# Patient Record
Sex: Female | Born: 1975
Health system: Southern US, Community
[De-identification: ages and names within clinical notes are randomized; demographics above are authoritative.]

## PROBLEM LIST (undated history)

## (undated) DIAGNOSIS — F419 Anxiety disorder, unspecified: Secondary | ICD-10-CM

## (undated) DIAGNOSIS — I10 Essential (primary) hypertension: Secondary | ICD-10-CM

## (undated) DIAGNOSIS — T4145XA Adverse effect of unspecified anesthetic, initial encounter: Secondary | ICD-10-CM

## (undated) DIAGNOSIS — O139 Gestational [pregnancy-induced] hypertension without significant proteinuria, unspecified trimester: Secondary | ICD-10-CM

## (undated) DIAGNOSIS — F329 Major depressive disorder, single episode, unspecified: Secondary | ICD-10-CM

## (undated) DIAGNOSIS — M47816 Spondylosis without myelopathy or radiculopathy, lumbar region: Secondary | ICD-10-CM

## (undated) DIAGNOSIS — F32A Depression, unspecified: Secondary | ICD-10-CM

## (undated) DIAGNOSIS — T8859XA Other complications of anesthesia, initial encounter: Secondary | ICD-10-CM

## (undated) DIAGNOSIS — M722 Plantar fascial fibromatosis: Secondary | ICD-10-CM

## (undated) HISTORY — DX: Depression, unspecified: F32.A

## (undated) HISTORY — DX: Other complications of anesthesia, initial encounter: T88.59XA

## (undated) HISTORY — DX: Gestational (pregnancy-induced) hypertension without significant proteinuria, unspecified trimester: O13.9

## (undated) HISTORY — DX: Major depressive disorder, single episode, unspecified: F32.9

## (undated) HISTORY — DX: Plantar fascial fibromatosis: M72.2

## (undated) HISTORY — DX: Spondylosis without myelopathy or radiculopathy, lumbar region: M47.816

## (undated) HISTORY — PX: BARTHOLIN GLAND CYST EXCISION: SHX565

## (undated) HISTORY — DX: Essential (primary) hypertension: I10

## (undated) HISTORY — DX: Anxiety disorder, unspecified: F41.9

## (undated) HISTORY — DX: Adverse effect of unspecified anesthetic, initial encounter: T41.45XA

## (undated) HISTORY — PX: EYE SURGERY: SHX253

---

## 1997-10-19 ENCOUNTER — Other Ambulatory Visit: Admission: RE | Admit: 1997-10-19 | Discharge: 1997-10-19 | Payer: Self-pay | Admitting: Obstetrics and Gynecology

## 1998-10-13 ENCOUNTER — Other Ambulatory Visit: Admission: RE | Admit: 1998-10-13 | Discharge: 1998-10-13 | Payer: Self-pay | Admitting: Obstetrics and Gynecology

## 1999-04-14 ENCOUNTER — Other Ambulatory Visit: Admission: RE | Admit: 1999-04-14 | Discharge: 1999-04-14 | Payer: Self-pay | Admitting: Obstetrics & Gynecology

## 1999-11-17 ENCOUNTER — Inpatient Hospital Stay (HOSPITAL_COMMUNITY): Admission: AD | Admit: 1999-11-17 | Discharge: 1999-11-20 | Payer: Self-pay | Admitting: Obstetrics and Gynecology

## 2000-10-11 ENCOUNTER — Other Ambulatory Visit: Admission: RE | Admit: 2000-10-11 | Discharge: 2000-10-11 | Payer: Self-pay | Admitting: Obstetrics and Gynecology

## 2001-11-13 ENCOUNTER — Other Ambulatory Visit: Admission: RE | Admit: 2001-11-13 | Discharge: 2001-11-13 | Payer: Self-pay | Admitting: Obstetrics and Gynecology

## 2003-02-27 ENCOUNTER — Other Ambulatory Visit: Admission: RE | Admit: 2003-02-27 | Discharge: 2003-02-27 | Payer: Self-pay | Admitting: Obstetrics and Gynecology

## 2004-04-20 ENCOUNTER — Other Ambulatory Visit: Admission: RE | Admit: 2004-04-20 | Discharge: 2004-04-20 | Payer: Self-pay | Admitting: Obstetrics and Gynecology

## 2005-03-27 HISTORY — PX: CYSTECTOMY: SUR359

## 2005-08-04 ENCOUNTER — Other Ambulatory Visit: Admission: RE | Admit: 2005-08-04 | Discharge: 2005-08-04 | Payer: Self-pay | Admitting: Obstetrics and Gynecology

## 2005-09-07 ENCOUNTER — Ambulatory Visit (HOSPITAL_COMMUNITY): Admission: RE | Admit: 2005-09-07 | Discharge: 2005-09-07 | Payer: Self-pay | Admitting: Obstetrics and Gynecology

## 2005-09-07 ENCOUNTER — Encounter (INDEPENDENT_AMBULATORY_CARE_PROVIDER_SITE_OTHER): Payer: Self-pay | Admitting: *Deleted

## 2007-05-25 ENCOUNTER — Inpatient Hospital Stay (HOSPITAL_COMMUNITY): Admission: AD | Admit: 2007-05-25 | Discharge: 2007-05-27 | Payer: Self-pay | Admitting: Obstetrics and Gynecology

## 2010-08-09 NOTE — H&P (Signed)
NAME:  Mary Gross, Mary Gross                ACCOUNT NO.:  [id number]   MEDICAL RECORD NO.:  [id number]          PATIENT TYPE:  MAT   LOCATION:  [facility]                          FACILITY:  [facility]   PHYSICIAN:  Osborn Coho, M.D.   DATE OF BIRTH:  11-May-1975   DATE OF ADMISSION:  05/25/2007  DATE OF DISCHARGE:                              HISTORY & PHYSICAL   HISTORY OF PRESENT ILLNESS:  The patient is a 35 year old, gravida 2,  para 1-0-0-1, who was admitted at 40-5/7 weeks with complaints of  regular uterine contractions since 4 p.m. on May 25, 2007, with  increased intensity.  The patient denies rupture of membranes or  bleeding.  The patient reports that her fetus has been moving normally.  The patient denies headache, vision changes, right upper quadrant pain  or increased edema.  The patient's pregnancy has been followed by the  CNM service at [facility]; however, no prenatal record is  available throughout the hospital-wide system at the present time.  The  patient's pregnancy remarkable for  1. History of pregnancy-induced hypertension with first pregnancy.  2. PENICILLIN allergy.  3. GBS negative.   PAST MEDICAL HISTORY:  Negative.   PAST SURGICAL HISTORY:  I&D of Bartholin's cyst in 2008.   OB HISTORY:  Pregnancy #1:  Vacuum delivery at term, viable female infant  in 2001 with pregnancy-induced hypertension but no other complications.   GYN HISTORY:  The patient denies any history of STDs or abnormal Pap  smears.   FAMILY HISTORY:  Significant for hypertension, CVA, and cancer.   CURRENT MEDICATIONS:  Prenatal vitamins.   ALLERGIES:  PENICILLIN, SULFA,  CODEINE, and IRON.   SOCIAL HISTORY:  The patient denies use of tobacco, alcohol, or street  drugs.   GENETIC HISTORY:  Negative.   PRENATAL LABORATORY DATA:  Blood type is B positive.  Antibody screen  negative.  Initial hemoglobin 12.1.  RPR negative, hepatitis B surface  antigen negative, HIV negative,  rubella immune.  A 28-week Glucola 122.  RPR negative, group B strep negative, gonorrhea and chlamydia cultures  negative.   HISTORY OF PRESENT PREGNANCY:  Due to prenatal record not being  available, pregnancy course is relatively unavailable.  However, with  the data of prenatal labs that were available, the patient entered care  at approximately [redacted] weeks gestation.  The patient did undergo an  ultrasound at 18 weeks that she reports was normal.  The patient did  have labs as scheduled throughout the pregnancy.   OBJECTIVE:  VITAL SIGNS:  The patient is afebrile.  Temperature 98.5.  Initial blood pressure 138/92 with repeat at 142/104.  Heart rate 84.  Fetal heart rate 140-150 baseline with accelerations present.  No  decelerations are noted.  HEENT:  Within normal limits.  NECK:  Thyroid normal, not enlarged.  HEART:  Regular rate and rhythm.  LUNGS:  Clear to percussion and auscultation.  ABDOMEN:  Soft, gravid and nontender.  Fundus is soft and nontender.  Fundal height 40 cm.  Fetus is vertex to [employer].  Uterine  contractions noted approximately every  2-3 minutes and mild to  palpation.  PELVIC:  Cervix 5 cm, 80% effaced, -2 station, with a bulging bag of  water on admission.  EXTREMITIES:  Trace edema, 1+ deep tendon reflexes, and no clonus.   ASSESSMENT:  1. Intrauterine pregnancy at 40-5/7 weeks.  2. Pregnancy-induced hypertension, rule out preeclampsia.   PLAN:  1. Patient admitted to birthing suite for consult with Dr. Su Hilt.  2. Will check PIH labs on admission.  3. The patient's labor is to be augmented with Pitocin at the present      time.  4. The patient desire epidural anesthesia, and epidural will be placed      at her request.      Rhona Leavens, CNM      Osborn Coho, M.D.  Electronically Signed    NOS/MEDQ  D:  05/25/2007  T:  05/26/2007  Job:  [id number]

## 2010-08-09 NOTE — H&P (Signed)
NAME:  Mary Gross, Mary Gross                ACCOUNT NO.:  [id number]   MEDICAL RECORD NO.:  [id number]          PATIENT TYPE:  MAT   LOCATION:  [facility]                          [facility]   PHYSICIAN:  Osborn Coho, M.D.   DATE OF BIRTH:  1975/08/15   DATE OF ADMISSION:  05/25/2007  DATE OF DISCHARGE:                              HISTORY & PHYSICAL   inaudible      Rhona Leavens, CNM      Osborn Coho, M.D.  Electronically Signed    NOS/MEDQ  D:  05/25/2007  T:  05/26/2007  Job:  [id number]

## 2010-08-12 NOTE — H&P (Signed)
[name], Gross           ACCOUNT NO.:  [id number]   MEDICAL RECORD NO.:  [id number]          PATIENT TYPE:  AMB   LOCATION:  [facility]                           [facility]  [facility]   PHYSICIAN:  Hal Morales, M.D.DATE OF BIRTH:  05-Sep-1975   DATE OF ADMISSION:  09/07/2005  DATE OF DISCHARGE:                                HISTORY & PHYSICAL   HISTORY OF PRESENT ILLNESS:  [name] is a 35 year old white female  with a history of recurrent left Bartholin's abscesses, who presents for  surgical management of a left vulvar cyst.  For the past four years, the  patient has experienced on a yearly basis a left Bartholin's abscess which  was managed with incision and drainage.  Between episodes of inflammation  and infection, the patient had a persistent cyst; however, it was  asymptomatic.  The patient admits that incision and drainage procedure  helped to relieve the pain associated with these episodes; however, she  desires to pursue a more definitive therapy due to the disruptive nature of  these infections.   PAST MEDICAL HISTORY:   OB HISTORY:  Gravida 1, para 1-0-0-1.   GYN HISTORY:  Menarche 35 years old.  The patient's last menstrual period  was Aug 20, 2005.  She uses vasectomy as mode of contraception. Denies any  history of abnormal Pap smears or sexually transmitted diseases (negative  gonorrhea and chlamydia cultures Aug 04, 2005).  Last normal Pap smear was  May 2007.   MEDICAL HISTORY:  Positive for elevated blood pressure.   SURGICAL HISTORY:  Negative.   FAMILY HISTORY:  Hypertension, migraines.   HABITS:  The patient does not use alcohol or tobacco.   SOCIAL HISTORY:  The patient is currently separated from her husband, and  she is a homemaker.   CURRENT MEDICATIONS:  None.   ALLERGIES:  No known drug allergies.   REVIEW OF SYSTEMS:  The patient does wear contact lenses. Except as  mentioned in History of Present Illness, the patient's Review of  Systems is  negative.   PHYSICAL EXAMINATION:  VITAL SIGNS: Blood pressure 140/100 (repeat blood  pressure 140/88).  Weight 197, height 5 feet 6 inches tall.  NECK:  Supple without masses.  There is no thyromegaly or cervical  adenopathy.  HEART:  Regular rate and rhythm.  There is no murmur.  LUNGS:  Clear to auscultation.  There are no wheezes, rales, or rhonchi.  BACK: No CVA tenderness.  ABDOMEN:  Bowel sounds present. It is soft without tenderness, guarding,  rebound, or organomegaly.  EXTREMITIES: Without clubbing, cyanosis, or edema.  PELVIC: EG/BUS. The left anterior vulvar area has a firm pea-size, slightly  mobile nodule which is without tenderness, pointing, or erythema.  There is  no inguinal adenopathy. Vagina is rugose.  Cervix is nontender without  lesions.  Uterus is normal size, shape, and consistency without tenderness.  Adnexa without tenderness or masses.   IMPRESSION:  Recurrent left vulvar abscess.   DISPOSITION:  Discussion was held with the patient regarding the indications  for procedure along with its risks which include but are not limited  to  reaction to anesthesia, damage to adjacent organs, infection, excessive  bleeding, and postoperative dyspareunia for an undetermined period of time.  The patient verbalized understanding of these risks and wishes to proceed  with left vulvar cyst marsupialization with the possibility of excision of  left vulvar cyst at [facility] on September 07, 2005, at  10:30 a.m.      Mary Gross.      Hal Morales, M.D.  Electronically Signed    EJP/MEDQ  D:  09/04/2005  T:  09/04/2005  Job:  [id number]

## 2010-08-12 NOTE — Op Note (Signed)
[facility]  Patient:    Mary Gross, Mary Gross                         MRN: [id number] Proc. Date: 11/18/99 Adm. Date:  [id number] Attending:  Dierdre Forth [place]                           Operative Report  PREOPERATIVE DIAGNOSES:           1. Prolonged second stage.                                   2. Meconium.  POSTOPERATIVE DIAGNOSES:          1. Prolonged second stage.                                   2. Meconium.  OB PHYSICIAN:                     Cecilio Asper, M.D.  ASSISTANT:                        Miguel Dibble, C.N.M.  OPERATION/PROCEDURE:              Low vacuum vaginal delivery.  ANESTHESIA:                       Epidural.  FINDINGS:                         Viable female infant assigned Apgar scores of 7 at one minute and 8 minutes.  Weight      .  Fetal monitoring was reactive. Placenta was intact with a three vessel cord.  No episiotomy but a second degree laceration noted.  ESTIMATED BLOOD LOSS:             Estimated blood loss was 400 cc.  INDICATIONS FOR PROCEDURE:        The patient is a 35 year old gravida 1 para 0 admitted with elevated blood pressure and no sign of preeclampsia.  The patients blood pressure stabilized and normalized during her labor course. She pushed for greater than two hours and was able to bring the bony vertex to a +2 station.  Molding, however, was at +3 station.  Maternal pelvis was felt to be adequate.  Contractions were strong and maternal pushing effort was maximal.  The patient could not seem to bring the vertex down any further. The risks and benefits and alternatives to low vacuum vaginal delivery were reviewed and the patient and her family accepted.  They were therefore consented.  DESCRIPTION OF PROCEDURE:         The patients bladder was drained of minimal urine and reassessment of the vertex revealed LOA positioning again with the bony vertex at +2 station and the molding to +3.   The - vacuum was placed on the vertex without difficulty x 1 and with the next contraction the bony vertex was brought to crowning.  At that point the vacuum was removed and Miguel Dibble proceeded with normal vaginal delivery without difficulty.  The placenta delivered spontaneously and at the time of this dictation the laceration is now being repaired.  The patient is expected to begin her recovery in stable condition. DD:  11/18/99 TD:  11/20/99 Job: [id number]

## 2010-08-12 NOTE — Discharge Summary (Signed)
[facility]  Patient:    Mary Gross, Mary Gross                         MRN: [id number] Adm. Date:  [id number] Disc. Date: [id number] Attending:  Shaune Spittle Dictator:   Mack Guise, C.N.M.                           Discharge Summary  ADMISSION DIAGNOSES:          1. Intrauterine pregnancy at term.                               2. Pregnancy-induced hypertension.  PROCEDURES:                   Vacuum-assisted vaginal delivery.  DISCHARGE DIAGNOSES:          1. Intrauterine pregnancy at term, delivered.                               2. Pregnancy-induced hypertension.                               3. Vacuum-assisted vaginal delivery.  HISTORY OF PRESENT ILLNESS:   [name] is a 35 year old, gravida 1, para 0, who presented at term with increased blood pressures for several weeks with no proteinuria and negative PIH labs and favorable cervix.  She was admitted for induction of labor.  HOSPITAL COURSE:              She proceeded to complete following artificial rupture of membranes and Pitocin augmentation of labor.  Her blood pressures stayed stable in the 130s/80s-90s throughout labor.  She progressed to complete and then had a prolonged second stage and was assisted with the vacuum for a vacuum-assisted vaginal delivery by Cecilio Asper, M.D., with the birth of a viable female infant with Apgar scores of 7 at one minute and 8 at five minutes and weight of 8 pounds 9 ounces.  The patient is doing well in the postpartum period with her blood pressure stable at 120-140/70-90. Her hemoglobin on her first postpartum day was 8.2.  On this her second postpartum day, she is judged to be ready for discharge.  DISCHARGE INSTRUCTIONS:       Per [facility] handout.  DISCHARGE MEDICATIONS:        1. Motrin 600 mg p.o. q.6h. p.r.n. pain.                               2. Tylenol No. 3 one to two p.o. q.3-4h. p.r.n.            pain.                               3. Hemocyte and prenatal vitamins.  The patient will start oral contraceptives at 6 weeks postpartum secondary to history of PIH if blood pressure within normal limits.  FOLLOW-UP:                    At [facility] in six weeks. DD:  11/20/99 TD:  11/21/99 Job:  [id number]

## 2010-08-12 NOTE — Op Note (Signed)
[name], Gross           ACCOUNT NO.:  [id number]   MEDICAL RECORD NO.:  [id number]          PATIENT TYPE:  AMB   LOCATION:  [facility]C                           [facility]   PHYSICIAN:  Hal Morales, M.D.DATE OF BIRTH:  [birth year]   DATE OF PROCEDURE:  09/07/2005  DATE OF DISCHARGE:                                 OPERATIVE REPORT   PREOPERATIVE DIAGNOSES:  1.  Left Bartholin's cyst.  2.  History of recurrent Bartholin's abscess.   POSTOPERATIVE DIAGNOSES:  1.  Left Bartholin's cyst.  2.  History of recurrent Bartholin's abscess.   OPERATION:  Excision of left Bartholin's cyst.   SURGEON:  Hal Morales, M.D.   ANESTHESIA:  General LMA.   ESTIMATED BLOOD LOSS:  150 cc.   COMPLICATIONS:  None.   FINDINGS:  The left Bartholin's gland was enlarged approximately 5 cm.  There was a moderate amount of cyst fluid which was nonpurulent.   PROCEDURE:  The patient was taken to the operating room after appropriate  identification and placed on the operating table.  After the attainment of  adequate general anesthesia, she was placed in lithotomy position.  The  perineum and vagina were prepped with multiple layers of Betadine and a non-  latex catheter used to empty the bladder.  The perineum was draped as a  sterile field.  The incision line was proposed along the inner labia majora,  and this area was infiltrated with a dilute solution of Pitressin.  The  vaginal mucosa and overlying labia were dissected off the cyst wall.  This  dissection used a combination of blunt and sharp dissection, trying to avoid  opening the cyst. However, the cyst wall did rupture during this excision  and dissection, and at that time, with the base of this identified, further  combination of blunt and sharp dissection allowed dissection around the  entirety of the cyst wall and excision of the cyst wall completely.  The  dead space which remained was made hemostatic with a combination  of Bovie  cautery and 4-0 Vicryl sutures.  The dead space was then closed with  interrupted mattress sutures of 3-0 Vicryl.  The incision line was closed  with a running interlocking suture of 4-0 Vicryl along the inner labia  majora.  Hemostasis was noted to be adequate, and the vagina was packed  tightly with 2-inch vaginal packing which had been moistened with Estrace  cream.  An ice pack was placed on the perineum.  The patient was awakened  from  general anesthesia and taken to the recovery room in satisfactory condition,  having tolerated procedure well, with sponge and instrument counts correct.   SPECIMENS TO PATHOLOGY:  Left Bartholin cyst wall.      Hal Morales, M.D.  Electronically Signed     VPH/MEDQ  D:  09/07/2005  T:  09/07/2005  Job:  [id number]

## 2010-08-12 NOTE — H&P (Signed)
[facility]  Patient:    Mary Gross, Mary Gross                         MRN: [id number] Adm. Date:  [id number] Attending:  Shaune Spittle Dictator:   Mack Guise, C.N.M.                         History and Physical  HISTORY OF PRESENT ILLNESS:   Mary Gross is a 35 year old gravida 1, para 0 at term, 40-4/7 weeks with increased blood pressure times several weeks, no proteinuria, negative PIH labs, and favorable cervix.  She is admitted for induction of labor.  Cervix is 3 cm, dilated, 90% effaced, with the vertex at a -2 station by MAU exam.  The fetal heart rate is reactive, will begin Pitocin augmentation of labor.  Her pregnancy has been followed by the CNM service at ______ [facility] and remarkable for uncertain dates.  With the EDD determined by pregnancy ultrasonography at 9 weeks and 4 days and confirmed with follow-up ultrasound.  The patient was initially evaluated at the office of ______ [facility] on April 12, 1999.  Her pregnancy has been essentially unremarkable until her last month of pregnancy when her blood pressure began to rise.  Prenatal lab work on April 14, 1999 - hemoglobin and hematocrit 13.5 and 37.6, platelets 306,000, blood type B positive, antibody screen negative, VDRL nonreactive, rubella immune, hepatitis B surface antigen negative, Pap smear within normal limits.  AFB/free beta HCG declined.  At 28 weeks, one hour glucose challenge 66 and hemoglobin 11.5.  At 36 weeks, culture of the vaginal track was negative for group B strep.  PAST MEDICAL HISTORY:         Unremarkable  FAMILY HISTORY:               Father with chronic hypertension on medication. Mother with varicosities.  Patients mother is anemic.  Patients father smokes.  GENETIC HISTORY:              Brother of the babys father with Downs syndrome.  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  Her  husband, Eligah East, works for [employer]  He is involved and supportive.  They are of the [facility].  ALLERGIES:                    Sulfa and penicillin - anaphylactic reaction.  PHYSICAL EXAMINATION:  VITAL SIGNS:                  Vital signs stable.  Systolic blood pressures have ranged from 125 to 140.  Diastolic blood pressures have ranged from 82 to 94.  HEENT:                        Unremarkable.  LUNGS:                        Clear.  HEART:                        Regular rate and rhythm.  ABDOMEN:                      Gravid in  its contour.  Uterine fundus is noted to extend 40 cm above the level of the pubic symphysis.  Leopolds maneuver finds the infant to be in a longitudinal lie, cephalic presentation, and the estimated fetal weight was 7-1/2 pounds.  PELVIC:                       Digital exam of the cervix finds it to be 3 cm dilated, 90% effaced, with cephalic presenting part at a -2 station with membranes intact.  The baseline with the fetal heart rate monitor is 130 to 140 with avid long-term variability.  Reactivity is present with no periodic changes.  ASSESSMENT:                   Intrauterine pregnancy at term.  ______  PLAN:                         Admit per Dr. Dierdre Forth, induction of labor using high-dose Pitocin protocol per Dr. Pennie Rushing.  Anticipate spontaneous vaginal delivery.  This has been explained to the patient in detail in language she can understand and she has indicated her agreement. DD:  11/17/99 TD:  11/17/99 Job: [id number]

## 2010-10-17 LAB — ANTIBODY SCREEN: Antibody Screen: NEGATIVE

## 2010-10-17 LAB — CYSTIC FIBROSIS DIAGNOSTIC STUDY: Interpretation-CFDNA:: NEGATIVE

## 2010-10-17 LAB — RUBELLA ANTIBODY, IGM: Rubella: IMMUNE

## 2010-10-17 LAB — RPR: RPR: NONREACTIVE

## 2010-10-17 LAB — HEPATITIS B SURFACE ANTIGEN: Hepatitis B Surface Ag: NEGATIVE

## 2010-12-19 LAB — COMPREHENSIVE METABOLIC PANEL
BUN: 2 — ABNORMAL LOW
CO2: 24
Calcium: 9.7
Chloride: 103
Creatinine, Ser: 0.52
GFR calc Af Amer: >60
GFR calc non Af Amer: >60
Total Bilirubin: 0.9

## 2010-12-19 LAB — CBC
HCT: 29.2 — ABNORMAL LOW
HCT: 25.2 — ABNORMAL LOW
MCHC: 35.8
MCV: 88
MCV: 89.9
Platelets: 273
Platelets: 201
RBC: 3.32 — ABNORMAL LOW
RDW: 13.5
WBC: 12.1 — ABNORMAL HIGH

## 2010-12-19 LAB — URINALYSIS, ROUTINE W REFLEX MICROSCOPIC
Glucose, UA: NEGATIVE
Protein, ur: NEGATIVE
Specific Gravity, Urine: <1.005 — ABNORMAL LOW
Urobilinogen, UA: 1

## 2010-12-19 LAB — DIFFERENTIAL
Basophils Absolute: 0.1
Lymphocytes Relative: 21
Lymphs Abs: 2.6
Neutro Abs: 8.7 — ABNORMAL HIGH
Neutrophils Relative %: 72

## 2010-12-19 LAB — RPR: RPR Ser Ql: NONREACTIVE

## 2010-12-19 LAB — LACTATE DEHYDROGENASE: LDH: 127

## 2010-12-19 LAB — URINE MICROSCOPIC-ADD ON

## 2011-03-28 NOTE — L&D Delivery Note (Signed)
Delivery Note  At 0152 cervix complete, AROM with clear fluid, FHR decel to 80's, pt pushed approximately twice,  At 2:00 AM a viable female was delivered via Vaginal, Spontaneous Delivery (Presentation: ; Occiput Posterior). Infant placed on mom's abdomen, dried and stimulated, cord doubly clamped and cut by FOB,   APGAR: 8, 9; weight 8 lb 2.3 oz (3695 g).   Placenta status: Intact, Spontaneous.  Cord: 3 vessels with the following complications: None.    Anesthesia: Epidural  Episiotomy: None Lacerations: 1st degree;Perineal Suture Repair: 3.0 vicryl Est. Blood Loss (mL): 200  Mom to postpartum.  Baby to nursery-stable. Skin-skin with mom Mom plans to bottle feed Notified Dr. Evlyn Kanner M 05/09/2011, 2:37 AM

## 2011-04-12 LAB — STREP B DNA PROBE: GBS: POSITIVE

## 2011-05-05 ENCOUNTER — Encounter (HOSPITAL_COMMUNITY): Payer: Self-pay | Admitting: *Deleted

## 2011-05-05 ENCOUNTER — Telehealth (HOSPITAL_COMMUNITY): Payer: Self-pay | Admitting: *Deleted

## 2011-05-05 NOTE — Telephone Encounter (Signed)
Preadmission screen  

## 2011-05-08 ENCOUNTER — Inpatient Hospital Stay (HOSPITAL_COMMUNITY)
Admission: AD | Admit: 2011-05-08 | Discharge: 2011-05-11 | DRG: 774 | Disposition: A | Discharge status: Home / Self Care | Payer: 59 | Source: Ambulatory Visit | Attending: Obstetrics and Gynecology | Admitting: Obstetrics and Gynecology

## 2011-05-08 ENCOUNTER — Encounter (HOSPITAL_COMMUNITY): Payer: Self-pay | Admitting: *Deleted

## 2011-05-08 DIAGNOSIS — Z22330 Carrier of Group B streptococcus: Secondary | ICD-10-CM

## 2011-05-08 DIAGNOSIS — O09299 Supervision of pregnancy with other poor reproductive or obstetric history, unspecified trimester: Secondary | ICD-10-CM

## 2011-05-08 DIAGNOSIS — O1002 Pre-existing essential hypertension complicating childbirth: Principal | ICD-10-CM | POA: Diagnosis present

## 2011-05-08 DIAGNOSIS — Z8659 Personal history of other mental and behavioral disorders: Secondary | ICD-10-CM

## 2011-05-08 DIAGNOSIS — I87309 Chronic venous hypertension (idiopathic) without complications of unspecified lower extremity: Secondary | ICD-10-CM | POA: Diagnosis not present

## 2011-05-08 DIAGNOSIS — O09529 Supervision of elderly multigravida, unspecified trimester: Secondary | ICD-10-CM | POA: Diagnosis present

## 2011-05-08 DIAGNOSIS — O139 Gestational [pregnancy-induced] hypertension without significant proteinuria, unspecified trimester: Secondary | ICD-10-CM | POA: Diagnosis present

## 2011-05-08 DIAGNOSIS — Z880 Allergy status to penicillin: Secondary | ICD-10-CM

## 2011-05-08 DIAGNOSIS — O99892 Other specified diseases and conditions complicating childbirth: Secondary | ICD-10-CM | POA: Diagnosis present

## 2011-05-08 MED ORDER — PHENYLEPHRINE 40 MCG/ML (10ML) SYRINGE FOR IV PUSH (FOR BLOOD PRESSURE SUPPORT)
80.0000 ug | PREFILLED_SYRINGE | INTRAVENOUS | Status: DC | PRN
  Filled 2011-05-08 (×2): qty 5

## 2011-05-08 MED ORDER — PHENYLEPHRINE 40 MCG/ML (10ML) SYRINGE FOR IV PUSH (FOR BLOOD PRESSURE SUPPORT)
80.0000 ug | PREFILLED_SYRINGE | INTRAVENOUS | Status: DC | PRN
  Filled 2011-05-08: qty 5

## 2011-05-08 MED ORDER — DIPHENHYDRAMINE HCL 50 MG/ML IJ SOLN: 12.5000 mg | INTRAMUSCULAR | Status: DC | PRN

## 2011-05-08 MED ORDER — LACTATED RINGERS IV SOLN: 500.0000 mL | INTRAVENOUS | Status: DC | PRN

## 2011-05-08 MED ORDER — FENTANYL 2.5 MCG/ML BUPIVACAINE 1/10 % EPIDURAL INFUSION (WH - ANES)
14.0000 mL/h | INTRAMUSCULAR | Status: DC
  Administered 2011-05-09: 14 mL/h via EPIDURAL
  Filled 2011-05-08: qty 60

## 2011-05-08 MED ORDER — EPHEDRINE 5 MG/ML INJ
10.0000 mg | INTRAVENOUS | Status: DC | PRN
  Filled 2011-05-08: qty 4

## 2011-05-08 MED ORDER — ONDANSETRON HCL 4 MG/2ML IJ SOLN: 4.0000 mg | Freq: Four times a day (QID) | INTRAMUSCULAR | Status: DC | PRN

## 2011-05-08 MED ORDER — LACTATED RINGERS IV SOLN: INTRAVENOUS | Status: DC

## 2011-05-08 MED ORDER — ACETAMINOPHEN 325 MG PO TABS: 650.0000 mg | ORAL_TABLET | ORAL | Status: DC | PRN

## 2011-05-08 MED ORDER — LIDOCAINE HCL (PF) 1 % IJ SOLN
30.0000 mL | INTRAMUSCULAR | Status: DC | PRN
  Filled 2011-05-08: qty 30

## 2011-05-08 MED ORDER — OXYTOCIN 20 UNITS IN LACTATED RINGERS INFUSION - SIMPLE
125.0000 mL/h | Freq: Once | INTRAVENOUS | Status: AC
  Administered 2011-05-09: 125 mL/h via INTRAVENOUS

## 2011-05-08 MED ORDER — IBUPROFEN 600 MG PO TABS: 600.0000 mg | ORAL_TABLET | Freq: Four times a day (QID) | ORAL | Status: DC | PRN

## 2011-05-08 MED ORDER — VANCOMYCIN HCL IN DEXTROSE 1-5 GM/200ML-% IV SOLN
1000.0000 mg | Freq: Two times a day (BID) | INTRAVENOUS | Status: DC
  Administered 2011-05-09: 1000 mg via INTRAVENOUS
  Filled 2011-05-08 (×2): qty 200

## 2011-05-08 MED ORDER — EPHEDRINE 5 MG/ML INJ
10.0000 mg | INTRAVENOUS | Status: DC | PRN
  Filled 2011-05-08 (×2): qty 4

## 2011-05-08 MED ORDER — OXYTOCIN 10 UNIT/ML IJ SOLN: 10.0000 [IU] | Freq: Once | INTRAMUSCULAR | Status: DC

## 2011-05-08 MED ORDER — OXYTOCIN BOLUS FROM INFUSION
500.0000 mL | Freq: Once | INTRAVENOUS | Status: DC
  Filled 2011-05-08: qty 500
  Filled 2011-05-08: qty 1000

## 2011-05-08 MED ORDER — LACTATED RINGERS IV SOLN: 500.0000 mL | Freq: Once | INTRAVENOUS | Status: DC

## 2011-05-08 MED ORDER — CITRIC ACID-SODIUM CITRATE 334-500 MG/5ML PO SOLN: 30.0000 mL | ORAL | Status: DC | PRN

## 2011-05-08 NOTE — Progress Notes (Signed)
SVE 8/BULGING. CNM WILL SEE IN MAU,

## 2011-05-09 ENCOUNTER — Encounter (HOSPITAL_COMMUNITY): Payer: Self-pay | Admitting: Anesthesiology

## 2011-05-09 ENCOUNTER — Inpatient Hospital Stay (HOSPITAL_COMMUNITY): Payer: 59 | Admitting: Anesthesiology

## 2011-05-09 ENCOUNTER — Encounter (HOSPITAL_COMMUNITY): Payer: Self-pay | Admitting: *Deleted

## 2011-05-09 DIAGNOSIS — Z880 Allergy status to penicillin: Secondary | ICD-10-CM

## 2011-05-09 DIAGNOSIS — O09299 Supervision of pregnancy with other poor reproductive or obstetric history, unspecified trimester: Secondary | ICD-10-CM

## 2011-05-09 DIAGNOSIS — I87309 Chronic venous hypertension (idiopathic) without complications of unspecified lower extremity: Secondary | ICD-10-CM | POA: Diagnosis not present

## 2011-05-09 DIAGNOSIS — O139 Gestational [pregnancy-induced] hypertension without significant proteinuria, unspecified trimester: Secondary | ICD-10-CM | POA: Diagnosis present

## 2011-05-09 DIAGNOSIS — Z8659 Personal history of other mental and behavioral disorders: Secondary | ICD-10-CM

## 2011-05-09 DIAGNOSIS — O09529 Supervision of elderly multigravida, unspecified trimester: Secondary | ICD-10-CM | POA: Diagnosis present

## 2011-05-09 LAB — CBC
MCHC: 33.7 g/dL (ref 30.0–36.0)
MCHC: 33.8 g/dL (ref 30.0–36.0)
MCV: 90.3 fL (ref 78.0–100.0)
Platelets: 213 10*3/uL (ref 150–400)
Platelets: 230 10*3/uL (ref 150–400)
Platelets: 235 10*3/uL (ref 150–400)
RBC: 3.68 MIL/uL — ABNORMAL LOW (ref 3.87–5.11)
RDW: 13.9 % (ref 11.5–15.5)
RDW: 14 % (ref 11.5–15.5)
RDW: 14 % (ref 11.5–15.5)
WBC: 13 10*3/uL — ABNORMAL HIGH (ref 4.0–10.5)
WBC: 19.1 10*3/uL — ABNORMAL HIGH (ref 4.0–10.5)
WBC: 19.9 10*3/uL — ABNORMAL HIGH (ref 4.0–10.5)

## 2011-05-09 LAB — COMPREHENSIVE METABOLIC PANEL
Alkaline Phosphatase: 97 U/L (ref 39–117)
BUN: 6 mg/dL (ref 6–23)
Calcium: 10.5 mg/dL (ref 8.4–10.5)
GFR calc Af Amer: >90 mL/min (ref >90)
Glucose, Bld: 85 mg/dL (ref 70–99)
Potassium: 3.6 mEq/L (ref 3.5–5.1)
Total Protein: 6.2 g/dL (ref 6.0–8.3)

## 2011-05-09 LAB — LACTATE DEHYDROGENASE: LDH: 144 U/L (ref 94–250)

## 2011-05-09 LAB — URINALYSIS, ROUTINE W REFLEX MICROSCOPIC
Protein, ur: NEGATIVE mg/dL
Urobilinogen, UA: 0.2 mg/dL (ref 0.0–1.0)

## 2011-05-09 LAB — URINE MICROSCOPIC-ADD ON

## 2011-05-09 MED ORDER — LANOLIN HYDROUS EX OINT: TOPICAL_OINTMENT | CUTANEOUS | Status: DC | PRN

## 2011-05-09 MED ORDER — MEASLES, MUMPS & RUBELLA VAC ~~LOC~~ INJ
0.5000 mL | INJECTION | Freq: Once | SUBCUTANEOUS | Status: DC
  Filled 2011-05-09: qty 0.5

## 2011-05-09 MED ORDER — ONDANSETRON HCL 4 MG/2ML IJ SOLN
4.0000 mg | INTRAMUSCULAR | Status: DC | PRN
Start: 2011-05-09 — End: 2011-05-11

## 2011-05-09 MED ORDER — OXYCODONE-ACETAMINOPHEN 5-325 MG PO TABS: 1.0000 | ORAL_TABLET | ORAL | Status: DC | PRN

## 2011-05-09 MED ORDER — ONDANSETRON HCL 4 MG PO TABS: 4.0000 mg | ORAL_TABLET | ORAL | Status: DC | PRN

## 2011-05-09 MED ORDER — WITCH HAZEL-GLYCERIN EX PADS
1.0000 "application " | MEDICATED_PAD | CUTANEOUS | Status: DC | PRN
  Administered 2011-05-10: 1 via TOPICAL

## 2011-05-09 MED ORDER — DIPHENHYDRAMINE HCL 25 MG PO CAPS: 25.0000 mg | ORAL_CAPSULE | Freq: Four times a day (QID) | ORAL | Status: DC | PRN

## 2011-05-09 MED ORDER — DIBUCAINE 1 % RE OINT: 1.0000 "application " | TOPICAL_OINTMENT | RECTAL | Status: DC | PRN

## 2011-05-09 MED ORDER — SENNOSIDES-DOCUSATE SODIUM 8.6-50 MG PO TABS
2.0000 | ORAL_TABLET | Freq: Every day | ORAL | Status: DC
  Administered 2011-05-09 – 2011-05-10 (×2): 2 via ORAL

## 2011-05-09 MED ORDER — TETANUS-DIPHTH-ACELL PERTUSSIS 5-2.5-18.5 LF-MCG/0.5 IM SUSP
0.5000 mL | Freq: Once | INTRAMUSCULAR | Status: AC
  Administered 2011-05-10: 0.5 mL via INTRAMUSCULAR
  Filled 2011-05-09: qty 0.5

## 2011-05-09 MED ORDER — IBUPROFEN 600 MG PO TABS
600.0000 mg | ORAL_TABLET | Freq: Four times a day (QID) | ORAL | Status: DC
  Administered 2011-05-09 – 2011-05-11 (×8): 600 mg via ORAL
  Filled 2011-05-09: qty 1
  Filled 2011-05-09: qty 2
  Filled 2011-05-09 (×5): qty 1

## 2011-05-09 MED ORDER — BENZOCAINE-MENTHOL 20-0.5 % EX AERO: 1.0000 "application " | INHALATION_SPRAY | CUTANEOUS | Status: DC | PRN

## 2011-05-09 MED ORDER — LIDOCAINE HCL (PF) 1 % IJ SOLN
INTRAMUSCULAR | Status: DC | PRN
  Administered 2011-05-09 (×3): 4 mL

## 2011-05-09 MED ORDER — ZOLPIDEM TARTRATE 5 MG PO TABS: 5.0000 mg | ORAL_TABLET | Freq: Every evening | ORAL | Status: DC | PRN

## 2011-05-09 MED ORDER — SIMETHICONE 80 MG PO CHEW: 80.0000 mg | CHEWABLE_TABLET | ORAL | Status: DC | PRN

## 2011-05-09 NOTE — H&P (Signed)
Adiah Guereca is a 36 y.o. female presenting for labor eval. Has had reg ctx for about 2 hours. Denies LOF, VB, GFM.   Pregnancy significant for: 1. CHTN/hx PIH 2. Hx macrosomia 3. Hx PPD 4. AMA 5. PCN/Sulfa allergies  Maternal Medical History:  Reason for admission: Reason for admission: contractions.  Contractions: Onset was 1-2 hours ago.   Frequency: regular.   Duration is approximately 60 seconds.   Perceived severity is strong.    Fetal activity: Perceived fetal activity is normal.   Last perceived fetal movement was within the past hour.    Prenatal complications: no prenatal complications   OB History    Grav Para Term Preterm Abortions TAB SAB Ect Mult Living   5 2 2  2  2   2      8-01 VAVD - female 28-9, 64wks, epidural, PIH 8-09 - SVD, female, 10-3, epidural, no comp 1-12 - 10wk SAB    Past Medical History  Diagnosis Date  . Hypertension   . History of cystitis   . Depression     after pregnancy loss was on zoloft  . Complication of anesthesia     leg weakness for 1 year after epidural in 2001  . AMA (advanced maternal age) multigravida 35+    Past Surgical History  Procedure Date  . Cystectomy 2007    vulvar cyst  . Eye surgery     lasik   Family History: family history includes Cancer in her maternal grandmother; Depression in her mother; Hypertension in her father and paternal aunt; Kidney disease in her father; Parkinsonism in her paternal grandmother; Peripheral vascular disease in her mother; Stroke in her paternal grandfather and paternal grandmother; and Thyroid disease in her maternal grandmother. Social History:  reports that she has never smoked. She has never used smokeless tobacco. She reports that she does not drink alcohol or use illicit drugs.  Pt is MWF, 104yrs education, stay at home mom  Review of Systems  All other systems reviewed and are negative.    Dilation: 8.5 Effacement (%): 90 Exam by:: J BURNETTE RN  Blood pressure  166/98, pulse 102, temperature 97.5 F (36.4 C), temperature source Oral, resp. rate 20, height 5\' 6"  (1.676 m), weight 89.812 kg (198 lb), last menstrual period 08/02/2010. Maternal Exam:  Uterine Assessment: Contraction strength is firm.  Contraction duration is 60 seconds. Contraction frequency is regular.   Abdomen: Patient reports no abdominal tenderness. Fundal height is aga.   Estimated fetal weight is 8-9.   Fetal presentation: vertex  Introitus: Normal vulva. Normal vagina.  Vagina is negative for discharge.  Pelvis: adequate for delivery.   Cervix: Cervix evaluated by digital exam.     Fetal Exam Fetal Monitor Review: Mode: ultrasound.   Baseline rate: 150.  Variability: moderate (6-25 bpm).   Pattern: accelerations present and no decelerations.    Fetal State Assessment: Category I - tracings are normal.     Physical Exam  Nursing note and vitals reviewed. Constitutional: She is oriented to person, place, and time. She appears well-developed and well-nourished.  HENT:  Head: Normocephalic.  Neck: Normal range of motion.  Cardiovascular: Normal rate.   Respiratory: Effort normal.  GI: Soft.  Genitourinary: Vagina normal. No vaginal discharge found.  Musculoskeletal: Normal range of motion. She exhibits no edema.  Neurological: She is alert and oriented to person, place, and time.  Skin: Skin is warm and dry.  Psychiatric: She has a normal mood and affect. Her behavior is  normal.    Prenatal labs: ABO, Rh: B/Positive/-- (07/23 0000) Antibody: Negative (07/23 0000) Rubella: Immune (07/23 0000) RPR: Nonreactive (07/23 0000)  HBsAg: Negative (07/23 0000)  HIV: Non-reactive (07/23 0000)  GBS: Positive (01/16 0000) sensitive to vancomycin Pap/GC/CT - neg 1st trimester screen Quad screen normal HgbA1C - nl Early 1hr gtt nl 3rd trimseter 1hr gtt nl   Assessment/Plan: IUP at 40w Active labor GBS pos - will order vancomycin FHR reassuring Pt desires  epidural BP elevated  Admit to B.S. Dr Su Hilt attending - CNM care Routine CNM orders PIH labs Will CTO BP closely Vancomycin IV Epidural ASAP  Dr Su Hilt notified   Sanda Klein M 05/09/2011, 12:23 AM

## 2011-05-09 NOTE — Progress Notes (Signed)
Post Partum Day 0 Subjective: no complaints.  24 hour urine via foley in place.  Denies PIH symptoms.  Bottlefeeding.  Up without syncope or dizziness.  Objective: Blood pressure 124/84, pulse 90, temperature 98.2 F (36.8 C), temperature source Oral, resp. rate 18, height 5\' 6"  (1.676 m), weight 89.812 kg (198 lb), last menstrual period 08/02/2010, SpO2 98.00%, unknown if currently breastfeeding.  Filed Vitals:   05/09/11 0316 05/09/11 0356 05/09/11 0500 05/09/11 0815  BP: 137/86 144/92 116/76 124/84  Pulse: 82 81 81 90  Temp:    98.2 F (36.8 C)  TempSrc:    Oral  Resp:  18 18 18   Height:      Weight:      SpO2:  99% 98% 98%    Physical Exam:  General: alert Lochia: appropriate Uterine Fundus: firm Incision: healing well DVT Evaluation: No evidence of DVT seen on physical exam. Negative Homan's sign.   Basename 05/09/11 0525 05/09/11 0300  HGB 11.6* 11.1*  HCT 34.3* 33.3*    Assessment/Plan: Continue monitoring BP Complete 24 hour urine at 2:15am 05/10/11.    LOS: 1 day   Nigel Bridgeman 05/09/2011, 8:47 AM

## 2011-05-09 NOTE — Anesthesia Postprocedure Evaluation (Signed)
   Anesthesia Post Note  Patient: Mary Gross  Procedure(s) Performed: * No procedures listed *  Anesthesia type: Epidural  Patient location: Mother/Baby  Post pain: Pain level controlled  Post assessment: Post-op Vital signs reviewed  Last Vitals:  Filed Vitals:   05/09/11 0500  BP: 116/76  Pulse: 81  Temp:   Resp: 18    Post vital signs: Reviewed  Level of consciousness: awake  Complications: No apparent anesthesia complications

## 2011-05-09 NOTE — Anesthesia Preprocedure Evaluation (Signed)
Anesthesia Evaluation  Patient identified by MRN, date of birth, ID band Patient awake    Reviewed: Allergy & Precautions, H&P , NPO status , Patient's Chart, lab work & pertinent test results, reviewed documented beta blocker date and time   Airway Mallampati: II TM Distance: >3 FB Neck ROM: full    Dental  (+) Teeth Intact   Pulmonary neg pulmonary ROS,  clear to auscultation        Cardiovascular neg cardio ROS regular Normal    Neuro/Psych PSYCHIATRIC DISORDERS (depression) Negative Neurological ROS  Negative Psych ROS   GI/Hepatic negative GI ROS, Neg liver ROS,   Endo/Other  Negative Endocrine ROS  Renal/GU negative Renal ROS     Musculoskeletal   Abdominal   Peds  Hematology negative hematology ROS (+)   Anesthesia Other Findings   Reproductive/Obstetrics (+) Pregnancy                           Anesthesia Physical Anesthesia Plan  ASA: II  Anesthesia Plan: Epidural   Post-op Pain Management:    Induction:   Airway Management Planned:   Additional Equipment:   Intra-op Plan:   Post-operative Plan:   Informed Consent: I have reviewed the patients History and Physical, chart, labs and discussed the procedure including the risks, benefits and alternatives for the proposed anesthesia with the patient or authorized representative who has indicated his/her understanding and acceptance.     Plan Discussed with:   Anesthesia Plan Comments:         Anesthesia Quick Evaluation

## 2011-05-09 NOTE — Progress Notes (Signed)
Foley catheter inserted after delivery Sent for UA to check protein Will keep foley in 24 hours to check protein CTO BP's

## 2011-05-09 NOTE — Progress Notes (Signed)
UR chart review completed.  

## 2011-05-09 NOTE — Anesthesia Procedure Notes (Addendum)
Epidural Patient location during procedure: OB Start time: 05/09/2011 12:26 AM Reason for block: procedure for pain  Staffing Performed by: anesthesiologist   Preanesthetic Checklist Completed: patient identified, site marked, surgical consent, pre-op evaluation, timeout performed, IV checked, risks and benefits discussed and monitors and equipment checked  Epidural Patient position: sitting Prep: site prepped and draped and DuraPrep Patient monitoring: continuous pulse ox and blood pressure Approach: midline Injection technique: LOR air  Needle:  Needle type: Tuohy  Needle gauge: 17 G Needle length: 9 cm Needle insertion depth: 5 cm cm Catheter type: closed end flexible Catheter size: 19 Gauge Catheter at skin depth: 10 cm Test dose: negative  Assessment Events: blood not aspirated, injection not painful, no injection resistance, negative IV test and no paresthesia  Additional Notes Discussed risk of headache, infection, bleeding, nerve injury and failed or incomplete block.  Patient voices understanding and wishes to proceed.  Explained that when epidural is administered in late labor (pt is 8-9 cm dilated) patient will likely not have complete relief, especially of perineum.  Mervyn Skeeters Charline Hoskinson< MD

## 2011-05-10 ENCOUNTER — Inpatient Hospital Stay (HOSPITAL_COMMUNITY): Admission: RE | Admit: 2011-05-10 | Payer: 59 | Source: Ambulatory Visit

## 2011-05-10 LAB — PROTEIN, URINE, 24 HOUR
Collection Interval-UPROT: 24 hours
Protein, Urine: <3 mg/dL

## 2011-05-10 NOTE — Progress Notes (Signed)
Post Partum Day 1 no pain, no plans for birth control, little bleeding, denies ha, visual spots or blurring, no swelling, would like to go home if baby can go Subjective: up ad lib and voiding  Objective: Blood pressure 126/84, pulse 83, temperature 98.5 F (36.9 C), temperature source Oral, resp. rate 18, height 5\' 6"  (1.676 m), weight 198 lb (89.812 kg), last menstrual period 08/02/2010, SpO2 97.00%, unknown if currently breastfeeding.  Physical Exam:  General: alert, cooperative and no distress Lochia: appropriate Uterine Fundus: firm Incision: healing well, no significant erythema DVT Evaluation: Negative Homan's sign. DTRS +1 bilaterally, no clonus, no edema to lower extremities  Basename 05/09/11 0525 05/09/11 0300  HGB 11.6* 11.1*  HCT 34.3* 33.3*   Results for orders placed during the hospital encounter of 05/08/11 (from the past 48 hour(s))  CBC     Status: Abnormal   Collection Time   05/09/11 12:00 AM      Component Value Range Comment   WBC 13.0 (*) 4.0 - 10.5 (K/uL)    RBC 3.95  3.87 - 5.11 (MIL/uL)    Hemoglobin 11.9 (*) 12.0 - 15.0 (g/dL)    HCT 16.1 (*) 09.6 - 46.0 (%)    MCV 89.4  78.0 - 100.0 (fL)    MCH 30.1  26.0 - 34.0 (pg)    MCHC 33.7  30.0 - 36.0 (g/dL)    RDW 04.5  40.9 - 81.1 (%)    Platelets 235  150 - 400 (K/uL)   RPR     Status: Normal   Collection Time   05/09/11 12:00 AM      Component Value Range Comment   RPR NON REACTIVE  NON REACTIVE    COMPREHENSIVE METABOLIC PANEL     Status: Abnormal   Collection Time   05/09/11 12:00 AM      Component Value Range Comment   Sodium 135  135 - 145 (mEq/L)    Potassium 3.6  3.5 - 5.1 (mEq/L)    Chloride 102  96 - 112 (mEq/L)    CO2 21  19 - 32 (mEq/L)    Glucose, Bld 85  70 - 99 (mg/dL)    BUN 6  6 - 23 (mg/dL)    Creatinine, Ser 9.14  0.50 - 1.10 (mg/dL)    Calcium 78.2  8.4 - 10.5 (mg/dL)    Total Protein 6.2  6.0 - 8.3 (g/dL)    Albumin 2.8 (*) 3.5 - 5.2 (g/dL)    AST 11  0 - 37 (U/L)    ALT 6   0 - 35 (U/L)    Alkaline Phosphatase 97  39 - 117 (U/L)    Total Bilirubin 0.5  0.3 - 1.2 (mg/dL)    GFR calc non Af Amer >90  >90 (mL/min)    GFR calc Af Amer >90  >90 (mL/min)   LACTATE DEHYDROGENASE     Status: Normal   Collection Time   05/09/11 12:00 AM      Component Value Range Comment   LD 144  94 - 250 (U/L)   URIC ACID     Status: Normal   Collection Time   05/09/11 12:00 AM      Component Value Range Comment   Uric Acid, Serum 5.7  2.4 - 7.0 (mg/dL)   PROTEIN, URINE, 24 HOUR     Status: Normal   Collection Time   05/09/11  2:15 AM      Component Value Range Comment   Urine Total  Volume-UPROT 2900      Collection Interval-UPROT 24      Protein, Urine <3      Protein, 24H Urine NOT CALC  50 - 100 (mg/day)   URINALYSIS, ROUTINE W REFLEX MICROSCOPIC     Status: Abnormal   Collection Time   05/09/11  2:40 AM      Component Value Range Comment   Color, Urine YELLOW  YELLOW     APPearance HAZY (*) CLEAR     Specific Gravity, Urine 1.010  1.005 - 1.030     pH 6.0  5.0 - 8.0     Glucose, UA NEGATIVE  NEGATIVE (mg/dL)    Hgb urine dipstick TRACE (*) NEGATIVE     Bilirubin Urine NEGATIVE  NEGATIVE     Ketones, ur 15 (*) NEGATIVE (mg/dL)    Protein, ur NEGATIVE  NEGATIVE (mg/dL)    Urobilinogen, UA 0.2  0.0 - 1.0 (mg/dL)    Nitrite NEGATIVE  NEGATIVE     Leukocytes, UA NEGATIVE  NEGATIVE    URINE MICROSCOPIC-ADD ON     Status: Abnormal   Collection Time   05/09/11  2:40 AM      Component Value Range Comment   Squamous Epithelial / LPF FEW (*) RARE     RBC / HPF 3-6  <3 (RBC/hpf)    Bacteria, UA FEW (*) RARE    CBC     Status: Abnormal   Collection Time   05/09/11  3:00 AM      Component Value Range Comment   WBC 19.1 (*) 4.0 - 10.5 (K/uL)    RBC 3.68 (*) 3.87 - 5.11 (MIL/uL)    Hemoglobin 11.1 (*) 12.0 - 15.0 (g/dL)    HCT 16.1 (*) 09.6 - 46.0 (%)    MCV 90.5  78.0 - 100.0 (fL)    MCH 30.2  26.0 - 34.0 (pg)    MCHC 33.3  30.0 - 36.0 (g/dL)    RDW 04.5  40.9 - 81.1  (%)    Platelets 213  150 - 400 (K/uL)   CBC     Status: Abnormal   Collection Time   05/09/11  5:25 AM      Component Value Range Comment   WBC 19.9 (*) 4.0 - 10.5 (K/uL)    RBC 3.80 (*) 3.87 - 5.11 (MIL/uL)    Hemoglobin 11.6 (*) 12.0 - 15.0 (g/dL)    HCT 91.4 (*) 78.2 - 46.0 (%)    MCV 90.3  78.0 - 100.0 (fL)    MCH 30.5  26.0 - 34.0 (pg)    MCHC 33.8  30.0 - 36.0 (g/dL)    RDW 95.6  21.3 - 08.6 (%)    Platelets 230  150 - 400 (K/uL)     Assessment/Plan: PP day 1 normal involution, hx CTHN PIH labs WNL no proteinuria Plan for discharge tomorrow Consider discharge if baby can go home  LOS: 2 days   Graylon Amory 05/10/2011, 9:24 AM

## 2011-05-11 MED ORDER — IBUPROFEN 600 MG PO TABS: 600.0000 mg | ORAL_TABLET | Freq: Four times a day (QID) | ORAL | Status: AC | PRN

## 2011-05-11 NOTE — Discharge Instructions (Signed)
Vaginal Delivery Care After  Change your pad on each trip to the bathroom.   Wipe gently with toilet paper during your hospital stay. Always wipe from front to back. A spray bottle with warm tap water could also be used or a towelette if available.   Place your soiled pad and toilet paper in a bathroom wastebasket with a plastic bag liner.   During your hospital stay, save any clots. If you pass a clot while on the toilet, do not flush it. Also, if your vaginal flow seems excessive to you, notify nursing personnel.   The first time you get out of bed after delivery, wait for assistance from a nurse. Do not get up alone at any time if you feel weak or dizzy.   Bend and extend your ankles forcefully so that you feel the calves of your legs get hard. Do this 6 times every hour when you are in bed and awake.   Do not sit with one foot under you, dangle your legs over the edge of the bed, or maintain a position that hinders the circulation in your legs.   Many women experience after pains for 2 to 3 days after delivery. These after pains are mild uterine contractions. Ask the nurse for a pain medication if you need something for this. Sometimes breastfeeding stimulates after pains; if you find this to be true, ask for the medication  -  hour before the next feeding.   For you and your infant's protection, do not go beyond the door(s) of the obstetric unit. Do not carry your baby in your arms in the hallway. When taking your baby to and from your room, put your baby in the bassinet and push the bassinet.   Mothers may have their babies in their room as much as they desire.   Infant Formula Feeding Breastfeeding is always recommended as the first choice for feeding a baby. This is sometimes called "exclusive breastfeeding." That is the goal. But sometimes it is not possible. For instance:  The baby's mother might not be physically able to breastfeed.   The mother might not be present.   The  mother might have a health problem. She could have an infection. Or she could be dehydrated (not have enough fluids).   Some mothers are taking medicines for cancer or another health problem. These medicines can get into breast milk. Some of the medicines could harm a baby.   Some babies need extra calories. They may have been tiny at birth. Or they might be having trouble gaining weight.  Giving a baby formula in these situations is not a bad thing. Other caregivers can feed the baby. This can give the mother a break for sleep or work. It also gives the baby a chance to bond with other people. PRECAUTIONS  Make sure you know just how much formula the baby should get at each feeding. For example, newborns need 2 to 3 ounces every 2 to 3 hours. Markings on the bottle can help you keep track. It may be helpful to keep a log of how much the baby eats at each feeding.   Do not give the infant anything other than breast milk or formula. A baby must not drink cow's milk, juice, soda, or other sweet drinks.   Do not add cereal to the milk or formula, unless the baby's healthcare provider has said to do so.   Always hold the bottle during feedings. Never prop up a  bottle to feed a baby.   Never let the baby fall asleep with a bottle in the crib.   Never feed the baby a bottle that has been at room temperature for over two hours or from a bottle used for a previous feeding. After the baby finishes a feeding, throw away any formula left in the bottle.  BEFORE FEEDING  Prepare a bottle of formula. If you are using formula that was stored in the refrigerator, warm it up. To do this, hold it under warm, running water or in a pan of hot water for a few minutes. Never use a microwave to warm up a bottle of formula.   Test the temperature of the formula. Place a few drops on the inside of your wrist. It should be warm, but not hot.   Find a location that is comfortable for you and the baby. A large chair  with arms to support your arms is often a good choice. You may want to put pillows under your arms and under the baby for support.   Make sure the room temperature is OK. It should not be too hot or too cold for you and for the baby.   Have some burp cloths nearby. You will need them to clean up spills or spit-ups.  TO FEED THE BABY  Hold the baby close to your body. Make eye contact. This helps bonding.   Support the baby's head in the crook of your arm. Cradle him or her at a slight angle. The baby's head should be higher than the stomach. A baby should not be fed while lying flat.   Hold the bottle of formula at an angle. The formula should completely fill the neck of the bottle. It should cover the nipple. This will keep the baby from sucking in air. Swallowing air is uncomfortable.   Stroke the baby's cheek or lower lip lightly with the nipple. This can get the baby to open his or her mouth. Then, slip the nipple into the baby's mouth. Sucking and swallowing should start. You might need to try different types of nipples to find the one your baby likes best.   Let the baby tell you when he or she is done. The baby's head might turn away. Or, the baby's lips might push away the nipple. It is OK if the baby does not finish the bottle.   You might need to burp the baby halfway through a feeding. Then, just start feeding again.   Burp the baby again when the feeding is done.  Document Released: 04/04/2009 Document Revised: 11/23/2010 Document Reviewed: 04/04/2009 [facility] Patient Information 2012 [place], [place].

## 2011-05-11 NOTE — Discharge Summary (Signed)
  Obstetric Discharge Summary Reason for Admission: onset of labor Prenatal Procedures: ultrasound Intrapartum Procedures: spontaneous vaginal delivery Postpartum Procedures: none Complications-Operative and Postpartum: none  General: alert, cooperative and no distress  Lochia: appropriate  Uterine Fundus: firm  Incision: healing well, no significant erythema  DVT Evaluation: Negative Homan's sign B/P stable PIH panel WNL  P: PP contraception-husband plans vasectomy; advised nothing in vagina x 6 weeks. Smart start nurse to check patients blood pressure 05/14/2010. Reviewed PIH signs and symptoms to report. Understanding of above instructions verbalized by patient and spouse.    Temp:  [98.1 F (36.7 C)-98.6 F (37 C)] 98.1 F (36.7 C) (02/14 0610) Pulse Rate:  [76-92] 76  (02/14 0610) Resp:  [18-20] 18  (02/14 0610) BP: (106-128)/(70-88) 106/70 mmHg (02/14 0610) Hemoglobin  Date Value Range Status  05/09/2011 11.6* 12.0-15.0 (g/dL) Final     HCT  Date Value Range Status  05/09/2011 34.3* 36.0-46.0 (%) Final    Hospital Course:  Hospital Course: Admitted in labor . Negative GBS. Progressed to fully dilated. Delivery was performed by Gevena Barre, CNM without difficulty. Patient and baby tolerated the procedure without difficulty, with no laceration noted. Infant to FTN. Mother and infant then had an uncomplicated postpartum course, with bottle feeding going well. Mom's physical exam was WNL, and she was discharged home in stable condition. Contraception plan- husband plans vasectomy.  She received adequate benefit from po pain medications. Discharge Diagnoses: Term Pregnancy-delivered  Discharge Information: Date: 05/11/2011 Activity: nothing in vagina x 6 weeks Diet: routine Medications:  Medication List  As of 05/11/2011  8:59 AM   START taking these medications         ibuprofen 600 MG tablet   Commonly known as: ADVIL,MOTRIN   Take 1 tablet (600 mg total) by mouth every 6  (six) hours as needed for pain.         CONTINUE taking these medications         prenatal multivitamin Tabs          Where to get your medications    These are the prescriptions that you need to pick up.   You may get these medications from any pharmacy.         ibuprofen 600 MG tablet           Condition: stable Instructions: refer to practice specific booklet Discharge to: home Follow-up Information    Follow up with [facility]. Call in 6 weeks.         Newborn Data: Live born  Information for the patient's newborn:  Ellese, Julius [[id number]]  female   Kizzie Fantasia CORI 05/11/2011, 8:59 AM

## 2011-05-11 NOTE — Progress Notes (Signed)
Sw referral received for history of PP depression however pt discharged before Sw could assess.  

## 2011-05-12 NOTE — Progress Notes (Signed)
Post discharge chart review completed.  

## 2011-06-20 ENCOUNTER — Ambulatory Visit (INDEPENDENT_AMBULATORY_CARE_PROVIDER_SITE_OTHER): Payer: 59 | Admitting: Obstetrics and Gynecology

## 2011-06-29 ENCOUNTER — Other Ambulatory Visit (HOSPITAL_COMMUNITY): Payer: Self-pay | Admitting: Pediatrics

## 2011-06-29 DIAGNOSIS — R1011 Right upper quadrant pain: Secondary | ICD-10-CM

## 2011-07-03 ENCOUNTER — Ambulatory Visit (HOSPITAL_COMMUNITY)
Admission: RE | Admit: 2011-07-03 | Discharge: 2011-07-03 | Disposition: A | Discharge status: Home / Self Care | Payer: 59 | Source: Ambulatory Visit | Attending: Pediatrics | Admitting: Pediatrics

## 2011-07-03 DIAGNOSIS — R1011 Right upper quadrant pain: Secondary | ICD-10-CM | POA: Insufficient documentation

## 2011-07-03 DIAGNOSIS — R932 Abnormal findings on diagnostic imaging of liver and biliary tract: Secondary | ICD-10-CM | POA: Insufficient documentation

## 2011-07-03 DIAGNOSIS — K8020 Calculus of gallbladder without cholecystitis without obstruction: Secondary | ICD-10-CM | POA: Insufficient documentation

## 2011-07-06 ENCOUNTER — Encounter (HOSPITAL_COMMUNITY): Payer: Self-pay | Admitting: Pharmacy Technician

## 2011-07-06 ENCOUNTER — Encounter (HOSPITAL_COMMUNITY): Payer: Self-pay

## 2011-07-06 ENCOUNTER — Encounter (HOSPITAL_COMMUNITY)
Admission: RE | Admit: 2011-07-06 | Discharge: 2011-07-06 | Disposition: A | Discharge status: Home / Self Care | Payer: 59 | Source: Ambulatory Visit | Attending: General Surgery | Admitting: General Surgery

## 2011-07-06 ENCOUNTER — Other Ambulatory Visit: Payer: Self-pay

## 2011-07-06 HISTORY — DX: Anxiety disorder, unspecified: F41.9

## 2011-07-06 LAB — DIFFERENTIAL
Eosinophils Absolute: 0.1 10*3/uL (ref 0.0–0.7)
Lymphs Abs: 2 10*3/uL (ref 0.7–4.0)
Monocytes Absolute: 0.4 10*3/uL (ref 0.1–1.0)
Monocytes Relative: 6 % (ref 3–12)
Neutrophils Relative %: 62 % (ref 43–77)

## 2011-07-06 LAB — CBC
HCT: 37.1 % (ref 36.0–46.0)
Hemoglobin: 12.6 g/dL (ref 12.0–15.0)
MCH: 30 pg (ref 26.0–34.0)
MCHC: 34 g/dL (ref 30.0–36.0)
MCV: 88.3 fL (ref 78.0–100.0)
RBC: 4.2 MIL/uL (ref 3.87–5.11)

## 2011-07-06 LAB — BASIC METABOLIC PANEL
BUN: 8 mg/dL (ref 6–23)
CO2: 27 mEq/L (ref 19–32)
Chloride: 101 mEq/L (ref 96–112)
GFR calc non Af Amer: >90 mL/min (ref >90)
Glucose, Bld: 76 mg/dL (ref 70–99)
Potassium: 4.1 mEq/L (ref 3.5–5.1)
Sodium: 137 mEq/L (ref 135–145)

## 2011-07-06 NOTE — Patient Instructions (Addendum)
20 AJAH VANHOOSE  07/06/2011   Your procedure is scheduled on:   07/07/2011  Report to [facility] at  945  AM.  Call this number if you have problems the morning of surgery: [phone]   Remember:   Do not eat food:After Midnight.  May have clear liquids:until Midnight .  Clear liquids include soda, tea, black coffee, apple or grape juice, broth.  Take these medicines the morning of surgery with A SIP OF WATER: zoloft   Do not wear jewelry, make-up or nail polish.  Do not wear lotions, powders, or perfumes. You may wear deodorant.  Do not shave 48 hours prior to surgery.  Do not bring valuables to the hospital.  Contacts, dentures or bridgework may not be worn into surgery.  Leave suitcase in the car. After surgery it may be brought to your room.  For patients admitted to the hospital, checkout time is 11:00 AM the day of discharge.   Patients discharged the day of surgery will not be allowed to drive home.  Name and phone number of your driver: family  Special Instructions: CHG Shower Use Special Wash: 1/2 bottle night before surgery and 1/2 bottle morning of surgery.   Please read over the following fact sheets that you were given: Pain Booklet, Surgical Site Infection Prevention, Anesthesia Post-op Instructions and Care and Recovery After Surgery Laparoscopic Cholecystectomy Laparoscopic cholecystectomy is surgery to remove the gallbladder. The gallbladder is located slightly to the right of center in the abdomen, behind the liver. It is a concentrating and storage sac for the bile produced in the liver. Bile aids in the digestion and absorption of fats. Gallbladder disease (cholecystitis) is an inflammation of your gallbladder. This condition is usually caused by a buildup of gallstones (cholelithiasis) in your gallbladder. Gallstones can block the flow of bile, resulting in inflammation and pain. In severe cases, emergency surgery may be required. When emergency surgery is not required,  you will have time to prepare for the procedure. Laparoscopic surgery is an alternative to open surgery. Laparoscopic surgery usually has a shorter recovery time. Your common bile duct may also need to be examined and explored. Your caregiver will discuss this with you if he or she feels this should be done. If stones are found in the common bile duct, they may be removed. LET YOUR CAREGIVER KNOW ABOUT:  Allergies to food or medicine.   Medicines taken, including vitamins, herbs, eyedrops, over-the-counter medicines, and creams.   Use of steroids (by mouth or creams).   Previous problems with anesthetics or numbing medicines.   History of bleeding problems or blood clots.   Previous surgery.   Other health problems, including diabetes and kidney problems.   Possibility of pregnancy, if this applies.  RISKS AND COMPLICATIONS All surgery is associated with risks. Some problems that may occur following this procedure include:  Infection.   Damage to the common bile duct, nerves, arteries, veins, or other internal organs such as the stomach or intestines.   Bleeding.   A stone may remain in the common bile duct.  BEFORE THE PROCEDURE  Do not take aspirin for 3 days prior to surgery or blood thinners for 1 week prior to surgery.   Do not eat or drink anything after midnight the night before surgery.   Let your caregiver know if you develop a cold or other infectious problem prior to surgery.   You should be present 60 minutes before the procedure or as directed.  PROCEDURE  You will be given medicine that makes you sleep (general anesthetic). When you are asleep, your surgeon will make several small cuts (incisions) in your abdomen. One of these incisions is used to insert a small, lighted scope (laparoscope) into the abdomen. The laparoscope helps the surgeon see into your abdomen. Carbon dioxide gas will be pumped into your abdomen. The gas allows more room for the surgeon to  perform your surgery. Other operating instruments are inserted through the other incisions. Laparoscopic procedures may not be appropriate when:  There is major scarring from previous surgery.   The gallbladder is extremely inflamed.   There are bleeding disorders or unexpected cirrhosis of the liver.   A pregnancy is near term.   Other conditions make the laparoscopic procedure impossible.  If your surgeon feels it is not safe to continue with a laparoscopic procedure, he or she will perform an open abdominal procedure. In this case, the surgeon will make an incision to open the abdomen. This gives the surgeon a larger view and field to work within. This may allow the surgeon to perform procedures that sometimes cannot be performed with a laparoscope alone. Open surgery has a longer recovery time. AFTER THE PROCEDURE  You will be taken to the recovery area where a nurse will watch and check your progress.   You may be allowed to go home the same day.   Do not resume physical activities until directed by your caregiver.   You may resume a normal diet and activities as directed.  Document Released: 03/13/2005 Document Revised: 03/02/2011 Document Reviewed: 08/26/2010 [facility] Patient Information 2012 [place], [place].PATIENT INSTRUCTIONS POST-ANESTHESIA  IMMEDIATELY FOLLOWING SURGERY:  Do not drive or operate machinery for the first twenty four hours after surgery.  Do not make any important decisions for twenty four hours after surgery or while taking narcotic pain medications or sedatives.  If you develop intractable nausea and vomiting or a severe headache please notify your doctor immediately.  FOLLOW-UP:  Please make an appointment with your surgeon as instructed. You do not need to follow up with anesthesia unless specifically instructed to do so.  WOUND CARE INSTRUCTIONS (if applicable):  Keep a dry clean dressing on the anesthesia/puncture wound site if there is drainage.  Once  the wound has quit draining you may leave it open to air.  Generally you should leave the bandage intact for twenty four hours unless there is drainage.  If the epidural site drains for more than 36-48 hours please call the anesthesia department.  QUESTIONS?:  Please feel free to call your physician or the hospital operator if you have any questions, and they will be happy to assist you.     [facility] Anesthesia Department [address] [place] [phone]

## 2011-07-06 NOTE — Pre-Procedure Instructions (Signed)
Patient has had hypertension during pregnancy. Been on Enalapril,changed to lisiniopril/hctz and taken off that and put on Zoloft. On no meds now. Dr Ruthann Cancer today is 156/91.

## 2011-07-06 NOTE — H&P (Signed)
  NTS SOAP Note  Vital Signs:  Vitals as of: 07/06/2011: Systolic 146: Diastolic 109: Heart Rate 87: Temp 97.58F: Height 33ft 6in: Weight 174Lbs 0 Ounces: OFC 0in: Respiratory Rate 0: O2 Saturation 0: Pain Level 0: BMI 28  BMI : 28.08 kg/m2  Subjective: This 4 Years 75 Months old Female presents for of  ABDOMINAL ISSUES: ,Has had several episodes of right upper quadrant abdominal pain, nausea, and indigestion over the past few weeks.  U/S of gallblader reveals cholelithiasis with normal common bile duct.  No fever, chills, jaundice.  Recently had child two months ago.  No problems during pregnancy.  Review of Symptoms:  Constitutional:unremarkable Head:unremarkable Eyes:unremarkable Nose/Mouth/Throat:unremarkable Cardiovascular:unremarkable Respiratory:unremarkable Gastrointestinabdominal pain,nausea,vomiting,heartburn Genitourinary:unremarkable back pain Skin:unremarkable Hematolgic/Lymphatic:unremarkable Allergic/Immunologic:unremarkable   Past Medical History:Reviewed   Past Medical History  Surgical History: eye surgery Medical Problems: HTN Allergies: pcn, sulfa, codeine, iron Medications: zoloft, fish oil, calcium   Social History:Reviewed  Social History  Preferred Language: English ([place]) Race:  White Ethnicity: Not Hispanic / Latino Age: 36 Years 3 Months Marital Status:  M Alcohol:  No Recreational drug(s):  No   Smoking Status: Never smoker reviewed on 07/06/2011  Family History:Reviewed   Family History              Maternal Grandfather:  Stroke             Maternal Grandmother:  Cancer-bladder    Objective Information: General:Well appearing, well nourished in no distress. No scleral icterus Heart:RRR, no murmur or gallop.  Normal S1, S2.  No S3, S4.  Lungs:CTA bilaterally, no wheezes, rhonchi, rales.  Breathing unlabored. Abdomen:Soft, ND, normal bowel sounds, no HSM, no  masses.  No peritoneal signs.  Slightly tender in right upper quadrant to palpation.  Assessment:biliary colic, cholelithiasis  Diagnosis &amp; Procedure: DiagnosisCode: 574.20, ProcedureCode: 62952,    Plan:Scheduled for laparoscopic cholecystectomy on 07/07/11.   Patient Education:Alternative treatments to surgery were discussed with patient (and family).Risks and benefits  of procedure were fully explained to the patient (and family) who gave informed consent. Patient/family questions were addressed.  Follow-up:Pending Surgery

## 2011-07-07 ENCOUNTER — Encounter (HOSPITAL_COMMUNITY)
Admission: RE | Disposition: A | Discharge status: Home / Self Care | Payer: Self-pay | Source: Ambulatory Visit | Attending: General Surgery

## 2011-07-07 ENCOUNTER — Ambulatory Visit (HOSPITAL_COMMUNITY)
Admission: RE | Admit: 2011-07-07 | Discharge: 2011-07-07 | Disposition: A | Discharge status: Home / Self Care | Payer: 59 | Source: Ambulatory Visit | Attending: General Surgery | Admitting: General Surgery

## 2011-07-07 ENCOUNTER — Encounter (HOSPITAL_COMMUNITY): Payer: Self-pay | Admitting: Anesthesiology

## 2011-07-07 ENCOUNTER — Encounter (HOSPITAL_COMMUNITY): Payer: Self-pay | Admitting: *Deleted

## 2011-07-07 ENCOUNTER — Ambulatory Visit (HOSPITAL_COMMUNITY): Payer: 59 | Admitting: Anesthesiology

## 2011-07-07 DIAGNOSIS — Z79899 Other long term (current) drug therapy: Secondary | ICD-10-CM | POA: Insufficient documentation

## 2011-07-07 DIAGNOSIS — I10 Essential (primary) hypertension: Secondary | ICD-10-CM | POA: Insufficient documentation

## 2011-07-07 DIAGNOSIS — K8010 Calculus of gallbladder with chronic cholecystitis without obstruction: Secondary | ICD-10-CM | POA: Insufficient documentation

## 2011-07-07 DIAGNOSIS — Z01812 Encounter for preprocedural laboratory examination: Secondary | ICD-10-CM | POA: Insufficient documentation

## 2011-07-07 HISTORY — PX: CHOLECYSTECTOMY: SHX55

## 2011-07-07 SURGERY — LAPAROSCOPIC CHOLECYSTECTOMY
Anesthesia: General | Site: Abdomen | Wound class: Clean Contaminated

## 2011-07-07 MED ORDER — CIPROFLOXACIN IN D5W 400 MG/200ML IV SOLN
INTRAVENOUS | Status: AC
  Administered 2011-07-07: 400 mg via INTRAVENOUS
  Filled 2011-07-07: qty 200

## 2011-07-07 MED ORDER — BUPIVACAINE HCL 0.5 % IJ SOLN
INTRAMUSCULAR | Status: DC | PRN
  Administered 2011-07-07: 10 mL

## 2011-07-07 MED ORDER — GLYCOPYRROLATE 0.2 MG/ML IJ SOLN
INTRAMUSCULAR | Status: AC
  Administered 2011-07-07: 0.2 mg via INTRAVENOUS
  Filled 2011-07-07: qty 1

## 2011-07-07 MED ORDER — ENOXAPARIN SODIUM 40 MG/0.4ML ~~LOC~~ SOLN
40.0000 mg | Freq: Once | SUBCUTANEOUS | Status: AC
  Administered 2011-07-07: 40 mg via SUBCUTANEOUS

## 2011-07-07 MED ORDER — NEOSTIGMINE METHYLSULFATE 1 MG/ML IJ SOLN
INTRAMUSCULAR | Status: DC | PRN
  Administered 2011-07-07: 3 mg via INTRAVENOUS

## 2011-07-07 MED ORDER — ONDANSETRON HCL 4 MG/2ML IJ SOLN
INTRAMUSCULAR | Status: AC
  Administered 2011-07-07: 4 mg via INTRAVENOUS
  Filled 2011-07-07: qty 2

## 2011-07-07 MED ORDER — GLYCOPYRROLATE 0.2 MG/ML IJ SOLN
0.2000 mg | Freq: Once | INTRAMUSCULAR | Status: AC
  Administered 2011-07-07: 0.2 mg via INTRAVENOUS

## 2011-07-07 MED ORDER — FENTANYL CITRATE 0.05 MG/ML IJ SOLN
INTRAMUSCULAR | Status: AC
  Filled 2011-07-07: qty 5

## 2011-07-07 MED ORDER — PROPOFOL 10 MG/ML IV EMUL
INTRAVENOUS | Status: DC | PRN
  Administered 2011-07-07: 200 mg via INTRAVENOUS

## 2011-07-07 MED ORDER — ACETAMINOPHEN 325 MG PO TABS: 325.0000 mg | ORAL_TABLET | ORAL | Status: DC | PRN

## 2011-07-07 MED ORDER — KETOROLAC TROMETHAMINE 30 MG/ML IJ SOLN
30.0000 mg | Freq: Once | INTRAMUSCULAR | Status: AC
  Administered 2011-07-07: 30 mg via INTRAVENOUS

## 2011-07-07 MED ORDER — LACTATED RINGERS IV SOLN
INTRAVENOUS | Status: DC | PRN
  Administered 2011-07-07 (×2): via INTRAVENOUS

## 2011-07-07 MED ORDER — HYDROCODONE-ACETAMINOPHEN 5-325 MG PO TABS: 1.0000 | ORAL_TABLET | ORAL | Status: AC | PRN

## 2011-07-07 MED ORDER — GLYCOPYRROLATE 0.2 MG/ML IJ SOLN
INTRAMUSCULAR | Status: AC
  Filled 2011-07-07: qty 2

## 2011-07-07 MED ORDER — MIDAZOLAM HCL 2 MG/2ML IJ SOLN
INTRAMUSCULAR | Status: AC
  Administered 2011-07-07: 1 mg via INTRAVENOUS
  Filled 2011-07-07: qty 2

## 2011-07-07 MED ORDER — ONDANSETRON HCL 4 MG/2ML IJ SOLN
INTRAMUSCULAR | Status: DC | PRN
  Administered 2011-07-07: 4 mg via INTRAVENOUS

## 2011-07-07 MED ORDER — ROCURONIUM BROMIDE 50 MG/5ML IV SOLN
INTRAVENOUS | Status: AC
  Filled 2011-07-07: qty 1

## 2011-07-07 MED ORDER — CIPROFLOXACIN IN D5W 400 MG/200ML IV SOLN: 400.0000 mg | INTRAVENOUS | Status: DC

## 2011-07-07 MED ORDER — PROPOFOL 10 MG/ML IV EMUL
INTRAVENOUS | Status: AC
  Filled 2011-07-07: qty 20

## 2011-07-07 MED ORDER — LACTATED RINGERS IV SOLN
INTRAVENOUS | Status: DC
  Administered 2011-07-07: 1000 mL via INTRAVENOUS

## 2011-07-07 MED ORDER — ENOXAPARIN SODIUM 40 MG/0.4ML ~~LOC~~ SOLN
SUBCUTANEOUS | Status: AC
  Administered 2011-07-07: 40 mg via SUBCUTANEOUS
  Filled 2011-07-07: qty 0.4

## 2011-07-07 MED ORDER — ONDANSETRON HCL 4 MG/2ML IJ SOLN: 4.0000 mg | Freq: Once | INTRAMUSCULAR | Status: DC | PRN

## 2011-07-07 MED ORDER — FENTANYL CITRATE 0.05 MG/ML IJ SOLN
INTRAMUSCULAR | Status: DC | PRN
  Administered 2011-07-07 (×2): 50 ug via INTRAVENOUS
  Administered 2011-07-07: 150 ug via INTRAVENOUS

## 2011-07-07 MED ORDER — MIDAZOLAM HCL 2 MG/2ML IJ SOLN
1.0000 mg | INTRAMUSCULAR | Status: DC | PRN
  Administered 2011-07-07: 1 mg via INTRAVENOUS

## 2011-07-07 MED ORDER — SODIUM CHLORIDE 0.9 % IR SOLN
Status: DC | PRN
  Administered 2011-07-07: 1000 mL

## 2011-07-07 MED ORDER — KETOROLAC TROMETHAMINE 30 MG/ML IJ SOLN
INTRAMUSCULAR | Status: AC
  Administered 2011-07-07: 30 mg via INTRAVENOUS
  Filled 2011-07-07: qty 1

## 2011-07-07 MED ORDER — GLYCOPYRROLATE 0.2 MG/ML IJ SOLN
INTRAMUSCULAR | Status: DC | PRN
  Administered 2011-07-07: .4 mg via INTRAVENOUS

## 2011-07-07 MED ORDER — FENTANYL CITRATE 0.05 MG/ML IJ SOLN: 25.0000 ug | INTRAMUSCULAR | Status: DC | PRN

## 2011-07-07 MED ORDER — BUPIVACAINE HCL (PF) 0.5 % IJ SOLN
INTRAMUSCULAR | Status: AC
  Filled 2011-07-07: qty 30

## 2011-07-07 MED ORDER — ONDANSETRON HCL 4 MG/2ML IJ SOLN
INTRAMUSCULAR | Status: AC
  Filled 2011-07-07: qty 2

## 2011-07-07 MED ORDER — HEMOSTATIC AGENTS (NO CHARGE) OPTIME
TOPICAL | Status: DC | PRN
  Administered 2011-07-07: 1 via TOPICAL

## 2011-07-07 MED ORDER — NEOSTIGMINE METHYLSULFATE 1 MG/ML IJ SOLN
INTRAMUSCULAR | Status: AC
  Filled 2011-07-07: qty 10

## 2011-07-07 MED ORDER — ONDANSETRON HCL 4 MG/2ML IJ SOLN
4.0000 mg | Freq: Once | INTRAMUSCULAR | Status: AC
  Administered 2011-07-07: 4 mg via INTRAVENOUS

## 2011-07-07 MED ORDER — ROCURONIUM BROMIDE 100 MG/10ML IV SOLN
INTRAVENOUS | Status: DC | PRN
  Administered 2011-07-07: 35 mg via INTRAVENOUS

## 2011-07-07 SURGICAL SUPPLY — 37 items
APPLIER CLIP ROT 10 11.4 M/L (STAPLE) ×2
APR CLP MED LRG 11.4X10 (STAPLE) ×1
BAG HAMPER (MISCELLANEOUS) ×2 IMPLANT
BAG SPEC RTRVL LRG 6X4 10 (ENDOMECHANICALS) ×1
CLIP APPLIE ROT 10 11.4 M/L (STAPLE) ×1 IMPLANT
CLOTH BEACON ORANGE TIMEOUT ST (SAFETY) ×2 IMPLANT
COVER SURGICAL LIGHT HANDLE (MISCELLANEOUS) ×4 IMPLANT
DECANTER SPIKE VIAL GLASS SM (MISCELLANEOUS) ×2 IMPLANT
DURAPREP 26ML APPLICATOR (WOUND CARE) ×2 IMPLANT
ELECT REM PT RETURN 9FT ADLT (ELECTROSURGICAL) ×2
ELECTRODE REM PT RTRN 9FT ADLT (ELECTROSURGICAL) ×1 IMPLANT
FILTER SMOKE EVAC LAPAROSHD (FILTER) ×2 IMPLANT
FORMALIN 10 PREFIL 120ML (MISCELLANEOUS) ×2 IMPLANT
GLOVE BIO SURGEON STRL SZ7.5 (GLOVE) ×2 IMPLANT
GLOVE ECLIPSE 6.5 STRL STRAW (GLOVE) ×1 IMPLANT
GLOVE ECLIPSE 7.0 STRL STRAW (GLOVE) ×1 IMPLANT
GLOVE EXAM NITRILE MD LF STRL (GLOVE) ×1 IMPLANT
GLOVE INDICATOR 7.0 STRL GRN (GLOVE) ×1 IMPLANT
GLOVE INDICATOR 7.5 STRL GRN (GLOVE) ×1 IMPLANT
GOWN STRL REIN XL XLG (GOWN DISPOSABLE) ×6 IMPLANT
INST SET LAPROSCOPIC AP (KITS) ×2 IMPLANT
KIT ROOM TURNOVER APOR (KITS) ×2 IMPLANT
KIT TROCAR LAP CHOLE (TROCAR) ×2 IMPLANT
MANIFOLD NEPTUNE II (INSTRUMENTS) ×2 IMPLANT
NDL HYPO 25X1 1.5 SAFETY (NEEDLE) IMPLANT
NEEDLE HYPO 25X1 1.5 SAFETY (NEEDLE) ×2 IMPLANT
NS IRRIG 1000ML POUR BTL (IV SOLUTION) ×2 IMPLANT
PACK LAP CHOLE LZT030E (CUSTOM PROCEDURE TRAY) ×2 IMPLANT
PAD ARMBOARD 7.5X6 YLW CONV (MISCELLANEOUS) ×2 IMPLANT
POUCH SPECIMEN RETRIEVAL 10MM (ENDOMECHANICALS) ×2 IMPLANT
SET BASIN LINEN APH (SET/KITS/TRAYS/PACK) ×2 IMPLANT
SPONGE GAUZE 2X2 8PLY STRL LF (GAUZE/BANDAGES/DRESSINGS) ×8 IMPLANT
STAPLER VISISTAT (STAPLE) ×2 IMPLANT
SUT VICRYL 0 UR6 27IN ABS (SUTURE) ×2 IMPLANT
SYR CONTROL 10ML LL (SYRINGE) ×1 IMPLANT
WARMER LAPAROSCOPE (MISCELLANEOUS) ×2 IMPLANT
YANKAUER SUCT 12FT TUBE ARGYLE (SUCTIONS) ×2 IMPLANT

## 2011-07-07 NOTE — Interval H&P Note (Signed)
History and Physical Interval Note:  07/07/2011 11:02 AM  Mary Gross  has presented today for surgery, with the diagnosis of Calculus of gallbladder with other cholecystitis, without mention of obstruction   The various methods of treatment have been discussed with the patient and family. After consideration of risks, benefits and other options for treatment, the patient has consented to  Procedure(s) (LRB): LAPAROSCOPIC CHOLECYSTECTOMY (N/A) as a surgical intervention .  The patients' history has been reviewed, patient examined, no change in status, stable for surgery.  I have reviewed the patients' chart and labs.  Questions were answered to the patient's satisfaction.     Franky Macho A

## 2011-07-07 NOTE — Op Note (Signed)
Patient:  Mary Gross  DOB:  10/21/1975  MRN:  [id number]   Preop Diagnosis:  Cholecystitis, cholelithiasis  Postop Diagnosis:  Same  Procedure:  Laparoscopic cholecystectomy  Surgeon:  Franky Macho, M.D.  Anes:  Gen. endotracheal  Indications:  Patient is a 36 year old white female presents with biliary colic secondary to cholelithiasis. Risks and benefits of the procedure including bleeding, infection, hepatobiliary injury, possibly open procedure were fully explained to the patient, and informed consent.  Procedure note:  Patient was placed in the supine position. After induction of general endotracheal anesthesia, the abdomen was prepped and draped using usual sterile technique with DuraPrep. Surgical site confirmation was performed.  A supraumbilical incision was made under the fascia. The Veress needle was introduced into the pelvic cavity and confirmation of placement was done using the saline drop test. The abdomen was then insufflated to 16 mm mercury pressure. An 11 mm trocar was introduced into the abdominal cavity under direct visualization without difficulty. The patient was placed in reverse Trendelenburg position an additional 11 mm trocar was placed the epigastric region and 5 mm trochars were placed right upper quadrant right flank regions. Liver was inspected and noted within normal limits. The gallbladder was retracted superiorly and laterally. Dissection was begun on the infundibulum of the gallbladder. The cystic duct was first identified. Junction to the infundibulum fully identified. Endoclips placed proximally and distally on the cystic duct the cystic duct was divided. This is likewise done the cystic artery. The gallbladder was then freed away from the gallbladder fossa using Bovie electrocautery. The gallbladder was delivered through the epigastric trocar site using an Endo Catch bag. Was sent to pathology further examination. The gallbladder fossa was inspected and  no abnormal bleeding or bile leakage was noted. Surgicel sponge the gallbladder fossa. All fluid and air were then evacuated from the abdominal cavity prior to mobile the trochars.  All wounds were irrigated normal saline. All wounds were injected with 0.5% Sensorcaine. The supraumbilical fascia was reapproximated using 0 Vicryl interrupted suture. All skin incisions were closed using staples. Betadine ointment rectal dressings were applied.  All tape and needle counts were correct at the end of the procedure. The patient was extubated in the operating room and went back to recovery room awake in stable condition.  Complications:  None  EBL:  Minimal  Specimen:  Gallbladder

## 2011-07-07 NOTE — Anesthesia Preprocedure Evaluation (Signed)
Anesthesia Evaluation  Patient identified by MRN, date of birth, ID band Patient awake    Reviewed: Allergy & Precautions, H&P , NPO status , Patient's Chart, lab work & pertinent test results  Airway Mallampati: I TM Distance: >3 FB Neck ROM: Full    Dental No notable dental hx.    Pulmonary    Pulmonary exam normal       Cardiovascular hypertension, Rhythm:Regular Rate:Normal     Neuro/Psych PSYCHIATRIC DISORDERS Anxiety Depression    GI/Hepatic Neg liver ROS,   Endo/Other  negative endocrine ROS  Renal/GU negative Renal ROS     Musculoskeletal negative musculoskeletal ROS (+)   Abdominal Normal abdominal exam  (+)   Peds  Hematology negative hematology ROS (+)   Anesthesia Other Findings   Reproductive/Obstetrics negative OB ROS                           Anesthesia Physical Anesthesia Plan  ASA: II  Anesthesia Plan: General   Post-op Pain Management:    Induction: Intravenous  Airway Management Planned: Oral ETT  Additional Equipment:   Intra-op Plan:   Post-operative Plan: Extubation in OR  Informed Consent: I have reviewed the patients History and Physical, chart, labs and discussed the procedure including the risks, benefits and alternatives for the proposed anesthesia with the patient or authorized representative who has indicated his/her understanding and acceptance.   Dental advisory given  Plan Discussed with: CRNA  Anesthesia Plan Comments:         Anesthesia Quick Evaluation

## 2011-07-07 NOTE — Anesthesia Postprocedure Evaluation (Signed)
  Anesthesia Post-op Note  Patient: Mary Gross  Procedure(s) Performed: Procedure(s) (LRB): LAPAROSCOPIC CHOLECYSTECTOMY (N/A)  Patient Location: PACU  Anesthesia Type: General  Level of Consciousness: awake, alert  and oriented  Airway and Oxygen Therapy: Patient Spontanous Breathing and Patient connected to face mask oxygen  Post-op Pain: none  Post-op Assessment: Post-op Vital signs reviewed, Patient's Cardiovascular Status Stable, Respiratory Function Stable, Patent Airway, No signs of Nausea or vomiting and Pain level controlled  Post-op Vital Signs: Reviewed and stable  Complications: No apparent anesthesia complications

## 2011-07-07 NOTE — Transfer of Care (Signed)
Immediate Anesthesia Transfer of Care Note  Patient: Mary Gross  Procedure(s) Performed: Procedure(s) (LRB): LAPAROSCOPIC CHOLECYSTECTOMY (N/A)  Patient Location: PACU  Anesthesia Type: General  Level of Consciousness: awake and alert   Airway & Oxygen Therapy: Patient Spontanous Breathing and Patient connected to nasal cannula oxygen  Post-op Assessment: Report given to PACU RN and Post -op Vital signs reviewed and stable  Post vital signs: Reviewed and stable  Complications: No apparent anesthesia complications

## 2011-07-07 NOTE — Preoperative (Signed)
Beta Blockers   Reason not to administer Beta Blockers:Not Applicable 

## 2011-07-07 NOTE — Discharge Instructions (Signed)
Laparoscopic Cholecystectomy Care After Refer to this sheet in the next few weeks. These instructions provide you with information on caring for yourself after your procedure. Your caregiver may also give you more specific instructions. Your treatment has been planned according to current medical practices, but problems sometimes occur. Call your caregiver if you have any problems or questions after your procedure. HOME CARE INSTRUCTIONS   Change bandages (dressings) as directed by your caregiver.   Keep the wound dry and clean. The wound may be washed gently with soap and water. Gently blot or dab the area dry.   Do not take baths or use swimming pools or hot tubs for 10 days, or as instructed by your caregiver.   Only take over-the-counter or prescription medicines for pain, discomfort, or fever as directed by your caregiver.   Continue your normal diet as directed by your caregiver.   Do not lift anything heavier than 25 pounds (11.5 kg), or as directed by your caregiver.   Do not play contact sports for 1 week, or as directed by your caregiver.  SEEK MEDICAL CARE IF:   There is redness, swelling, or increasing pain in the wound.   You notice yellowish-white fluid (pus) coming from the wound.   There is drainage from the wound that lasts longer than 1 day.   There is a bad smell coming from the wound or dressing.   The surgical cut (incision) breaks open.  SEEK IMMEDIATE MEDICAL CARE IF:   You develop a rash.   You have difficulty breathing.   You develop chest pain.   You develop any reaction or side effects to medicines given.   You have a fever.   You have increasing pain in the shoulders (shoulder strap areas).   You have dizzy episodes or faint while standing.   You develop severe abdominal pain.   You feel sick to your stomach (nauseous) or throw up (vomit) and this lasts for more than 1 day.  MAKE SURE YOU:   Understand these instructions.   Will watch  your condition.   Will get help right away if you are not doing well or get worse.  Document Released: 03/13/2005 Document Revised: 03/02/2011 Document Reviewed: 08/26/2010 Exit[employer] Patient Information 2012 [employer]. 

## 2011-07-12 ENCOUNTER — Encounter (HOSPITAL_COMMUNITY): Payer: Self-pay | Admitting: General Surgery

## 2011-07-27 DIAGNOSIS — M47816 Spondylosis without myelopathy or radiculopathy, lumbar region: Secondary | ICD-10-CM

## 2011-07-27 HISTORY — DX: Spondylosis without myelopathy or radiculopathy, lumbar region: M47.816

## 2011-12-14 ENCOUNTER — Ambulatory Visit: Payer: 59

## 2011-12-29 ENCOUNTER — Ambulatory Visit: Payer: 59 | Admitting: Family Medicine

## 2012-01-01 ENCOUNTER — Ambulatory Visit (INDEPENDENT_AMBULATORY_CARE_PROVIDER_SITE_OTHER): Payer: 59 | Admitting: Family Medicine

## 2012-01-01 ENCOUNTER — Encounter: Payer: Self-pay | Admitting: Family Medicine

## 2012-01-01 VITALS — BP 139/96 | HR 72 | Temp 98.4°F | Ht 66.0 in | Wt 180.0 lb

## 2012-01-01 DIAGNOSIS — R030 Elevated blood-pressure reading, without diagnosis of hypertension: Secondary | ICD-10-CM

## 2012-01-01 DIAGNOSIS — Z23 Encounter for immunization: Secondary | ICD-10-CM

## 2012-01-01 NOTE — Assessment & Plan Note (Addendum)
White coat HTN. Reassured pt, encouraged pt to continue with periodic bp monitoring at home, call if consistently >140 on top or >90 on bottom. CBC and BMET 06/2011 normal.  Underscored the importance of TLC to lower her risk of HTN.

## 2012-01-01 NOTE — Progress Notes (Signed)
Office Note 01/01/2012  CC:  Chief Complaint  Patient presents with  . Establish Care    HPI:  Mary Gross is a 36 y.o. White female who is here to establish care/transfer care. Patient's most recent primary MD: [facility] (Dr. Milford Cage) in [place]. Old records in EPIC/[facility] EMR were reviewed prior to or during today's visit.  She has no complaints today. Checks bp's at home, normal every time.  Often has mild HTN at MD office visits and she also had some PIH in the past.    Past Medical History  Diagnosis Date  . Depression     after pregnancy loss was on zoloft  . Complication of anesthesia     leg weakness for 1 year after epidural in 2001  . AMA (advanced maternal age) multigravida 35+   . Hypertension     during pregnancy, + white coat (trial of enalapril  + ACE/HCTZ caused bp too low/sluggish.  Repeated home bp measurements normal.  . Anxiety     zoloft 25mg  helps a lot  . Pregnancy-induced hypertension, history with 1st preg 05/09/2011    Past Surgical History  Procedure Date  . Cystectomy 2007    vulvar cyst  . Eye surgery     lasik  . Bartholin gland cyst excision     7-8 yrs ago  . Cholecystectomy 07/07/2011    Procedure: LAPAROSCOPIC CHOLECYSTECTOMY;  Surgeon: Dalia Heading, MD;  Location: [facility];  Service: General;  Laterality: N/A;    Family History  Problem Relation Age of Onset  . Peripheral vascular disease Mother   . Depression Mother   . Hypertension Father   . Kidney disease Father     Bright's Disease as a child  . Hypertension Paternal Aunt   . Thyroid disease Maternal Grandmother   . Cancer Maternal Grandmother     bladder   . Stroke Paternal Grandmother   . Parkinsonism Paternal Grandmother   . Stroke Paternal Grandfather   . Anesthesia problems Neg Hx   . Hypotension Neg Hx   . Malignant hyperthermia Neg Hx   . Pseudochol deficiency Neg Hx     History   Social History  . Marital Status: Married    Spouse Name: N/A    Number of Children: N/A  . Years of Education: N/A   Occupational History  . Not on file.   Social History Main Topics  . Smoking status: Never Smoker   . Smokeless tobacco: Never Used  . Alcohol Use: No  . Drug Use: No  . Sexually Active: Yes    Birth Control/ Protection: None   Other Topics Concern  . 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  Normal american diet.    Outpatient Encounter Prescriptions as of 01/01/2012  Medication Sig Dispense Refill  . Calcium Carbonate-Vitamin D (CALCIUM 600/VITAMIN D) 600-400 MG-UNIT per chew tablet Chew 1 tablet by mouth daily.      Marland Kitchen CRANBERRY PO Take 2 tablets by mouth daily.      . Multiple Vitamins-Calcium (ONE-A-DAY WOMENS FORMULA PO) Take 1 tablet by mouth daily.      . Omega-3 Fatty Acids (FISH OIL PO) Take 1 capsule by mouth daily.      . Probiotic Product (PROBIOTIC FORMULA PO) Take 1 capsule by mouth daily.      Marland Kitchen  sertraline (ZOLOFT) 25 MG tablet Take 25 mg by mouth daily.      Marland Kitchen DISCONTD: Ascorbic Acid (VITAMIN C) 1000 MG tablet Take 500 mg by mouth daily.        Allergies  Allergen Reactions  . Codeine Rash  . Iron Rash  . Penicillins Rash  . Sulfa Antibiotics Rash    ROS Review of Systems  Constitutional: Negative for fever and fatigue.  HENT: Negative for congestion and sore throat.   Eyes: Negative for visual disturbance.  Respiratory: Negative for cough.   Cardiovascular: Negative for chest pain.  Gastrointestinal: Negative for nausea and abdominal pain.  Genitourinary: Negative for dysuria.  Musculoskeletal: Negative for back pain and joint swelling.  Skin: Negative for rash.  Neurological: Negative for weakness and headaches.  Hematological: Negative for adenopathy.    PE; Blood pressure 139/96, pulse 72, temperature  98.4 F (36.9 C), temperature source Temporal, height 5\' 6"  (1.676 m), weight 180 lb (81.647 kg), last menstrual period 12/07/2011, SpO2 100.00%. Gen: Alert, well appearing.  Patient is oriented to person, place, time, and situation. ZOX:WRUE: no injection, icteris, swelling, or exudate.  EOMI, PERRLA. Nose: no drainage or turbinate edema/swelling.  No injection or focal lesion.  Mouth: lips without lesion/swelling.  Oral mucosa pink and moist.  Dentition intact and without obvious caries or gingival swelling.  Oropharynx without erythema, exudate, or swelling.  Neck - No masses or thyromegaly or limitation in range of motion CV: RRR, no m/r/g.   LUNGS: CTA bilat, nonlabored resps, good aeration in all lung fields. ABD: soft, NT, ND, BS normal.  No hepatospenomegaly or mass.  No bruits. EXT: no clubbing, cyanosis, or edema.  BACK: full ROM, nontender.  Pertinent labs:  none  ASSESSMENT AND PLAN:   Transfer pt from [facility]: obtain old records.  Elevated blood pressure reading without diagnosis of hypertension White coat HTN. Reassured pt, encouraged pt to continue with periodic bp monitoring at home, call if consistently >140 on top or >90 on bottom. CBC and BMET 06/2011 normal.  Underscored the importance of TLC to lower her risk of HTN.  Flu vaccine IM today.  An After Visit Summary was printed and given to the patient.  Return for CPE at your convenience (morning appt so she can get fasting labs with this visit).Marland Kitchen

## 2012-01-03 ENCOUNTER — Ambulatory Visit (INDEPENDENT_AMBULATORY_CARE_PROVIDER_SITE_OTHER): Payer: 59

## 2012-01-03 VITALS — BP 110/80 | Resp 16 | Wt 180.0 lb

## 2012-01-03 DIAGNOSIS — Z124 Encounter for screening for malignant neoplasm of cervix: Secondary | ICD-10-CM

## 2012-01-03 NOTE — Progress Notes (Signed)
The patient reports:no complaints  Contraception:no method  Last mammogram: not applicable Last pap: was normal November 14, 2010  GC/Chlamydia cultures offered: declined HIV/RPR/HbsAg offered:  declined HSV 1 and 2 glycoprotein offered: declined  Menstrual cycle regular and monthly: Yes Menstrual flow normal: Yes  Urinary symptoms: none Normal bowel movements: Yes Reports abuse at home: No:

## 2012-01-04 LAB — PAP IG W/ RFLX HPV ASCU

## 2012-01-18 ENCOUNTER — Ambulatory Visit (INDEPENDENT_AMBULATORY_CARE_PROVIDER_SITE_OTHER): Payer: 59 | Admitting: Family Medicine

## 2012-01-18 ENCOUNTER — Encounter: Payer: Self-pay | Admitting: Family Medicine

## 2012-01-18 VITALS — BP 132/90 | HR 77 | Ht 66.25 in | Wt 179.0 lb

## 2012-01-18 DIAGNOSIS — Z0000 Encounter for general adult medical examination without abnormal findings: Secondary | ICD-10-CM

## 2012-01-18 NOTE — Progress Notes (Signed)
Office Note 01/18/2012  CC:  Chief Complaint  Patient presents with  . Annual Exam    no problems    HPI:  Mary Gross is a 36 y.o. White female who is here for health maintenance exam. She feels well, has no complaints.  She has a GYN MD who does her pelvic exam/pap.   Past Medical History  Diagnosis Date  . Depression     after pregnancy loss was on zoloft  . Complication of anesthesia     leg weakness for 1 year after epidural in 2001  . AMA (advanced maternal age) multigravida 35+   . Hypertension     during pregnancy, + white coat (trial of enalapril  + ACE/HCTZ caused bp too low/sluggish.  Repeated home bp measurements normal.  . Anxiety     zoloft 25mg  helps a lot  . Pregnancy-induced hypertension, history with 1st preg 05/09/2011    Past Surgical History  Procedure Date  . Cystectomy 2007    vulvar cyst  . Eye surgery     lasik  . Bartholin gland cyst excision     7-8 yrs ago  . Cholecystectomy 07/07/2011    Procedure: LAPAROSCOPIC CHOLECYSTECTOMY;  Surgeon: Dalia Heading, MD;  Location: [facility];  Service: General;  Laterality: N/A;    Family History  Problem Relation Age of Onset  . Peripheral vascular disease Mother   . Depression Mother   . Hypertension Father   . Kidney disease Father     Bright's Disease as a child  . Hypertension Paternal Aunt   . Thyroid disease Maternal Grandmother   . Cancer Maternal Grandmother     bladder   . Stroke Paternal Grandmother   . Parkinsonism Paternal Grandmother   . Stroke Paternal Grandfather   . Anesthesia problems Neg Hx   . Hypotension Neg Hx   . Malignant hyperthermia Neg Hx   . Pseudochol deficiency Neg Hx   . Hypertension Maternal Uncle     History   Social History  . Marital Status: Married    Spouse Name: N/A    Number of Children: N/A  . Years of Education: N/A   Occupational History  . Not on file.   Social History Main Topics  . Smoking status: Never Smoker   . Smokeless  tobacco: Never Used  . Alcohol Use: No  . Drug Use: No  . Sexually Active: Yes -- Female partner(s)    Birth Control/ Protection: None   Other Topics Concern  . 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  Normal american diet.    Outpatient Prescriptions Prior to Visit  Medication Sig Dispense Refill  . Calcium Carbonate-Vitamin D (CALCIUM 600/VITAMIN D) 600-400 MG-UNIT per chew tablet Chew 1 tablet by mouth daily.      Marland Kitchen CRANBERRY PO Take 2 tablets by mouth daily.      . Multiple Vitamins-Calcium (ONE-A-DAY WOMENS FORMULA PO) Take 1 tablet by mouth daily.      . Omega-3 Fatty Acids (FISH OIL PO) Take 1 capsule by mouth daily.      . Probiotic Product (PROBIOTIC FORMULA PO) Take 1 capsule by mouth daily.      . sertraline (ZOLOFT) 25 MG tablet Take 25 mg by mouth daily.        Allergies  Allergen  Reactions  . Codeine Rash  . Iron Rash  . Penicillins Rash  . Sulfa Antibiotics Rash    ROS Review of Systems  Constitutional: Negative for fever, chills, appetite change and fatigue.  HENT: Negative for ear pain, congestion, sore throat, neck stiffness and dental problem.   Eyes: Negative for discharge, redness and visual disturbance.  Respiratory: Negative for cough, chest tightness, shortness of breath and wheezing.   Cardiovascular: Negative for chest pain, palpitations and leg swelling.  Gastrointestinal: Negative for nausea, vomiting, abdominal pain, diarrhea and blood in stool.  Genitourinary: Negative for dysuria, urgency, frequency, hematuria, flank pain and difficulty urinating.  Musculoskeletal: Negative for myalgias, back pain, joint swelling and arthralgias.  Skin: Negative for pallor and rash.  Neurological: Negative for dizziness, speech difficulty, weakness and headaches.    Hematological: Negative for adenopathy. Does not bruise/bleed easily.  Psychiatric/Behavioral: Negative for confusion and disturbed wake/sleep cycle. The patient is not nervous/anxious.     PE; Blood pressure 132/90, pulse 77, height 5' 6.25" (1.683 m), weight 179 lb (81.194 kg), last menstrual period 12/07/2011, SpO2 97.00%, not currently breastfeeding. Gen: Alert, well appearing.  Patient is oriented to person, place, time, and situation. AFFECT: pleasant, lucid thought and speech. ENT: Ears: EACs clear, normal epithelium.  TMs with good light reflex and landmarks bilaterally.  Eyes: no injection, icteris, swelling, or exudate.  EOMI, PERRLA. Nose: no drainage or turbinate edema/swelling.  No injection or focal lesion.  Mouth: lips without lesion/swelling.  Oral mucosa pink and moist.  Dentition intact and without obvious caries or gingival swelling.  Oropharynx without erythema, exudate, or swelling.  Neck: supple/nontender.  No LAD, mass, or TM.  Carotid pulses 2+ bilaterally, without bruits. CV: RRR, no m/r/g.   LUNGS: CTA bilat, nonlabored resps, good aeration in all lung fields. ABD: soft, NT, ND, BS normal.  No hepatospenomegaly or mass.  No bruits. EXT: no clubbing, cyanosis, or edema.  Musculoskeletal: no joint swelling, erythema, warmth, or tenderness.  ROM of all joints intact. Skin - no sores or suspicious lesions or rashes or color changes  Pertinent labs:  None today  Lab Results  Component Value Date   WBC 6.6 07/06/2011   HGB 12.6 07/06/2011   HCT 37.1 07/06/2011   MCV 88.3 07/06/2011   PLT 260 07/06/2011     Chemistry      Component Value Date/Time   NA 137 07/06/2011 1055   K 4.1 07/06/2011 1055   CL 101 07/06/2011 1055   CO2 27 07/06/2011 1055   BUN 8 07/06/2011 1055   CREATININE 0.73 07/06/2011 1055      Component Value Date/Time   CALCIUM 9.8 07/06/2011 1055   ALKPHOS 97 05/09/2011 0000   AST 11 05/09/2011 0000   ALT 6 05/09/2011 0000   BILITOT 0.5 05/09/2011 0000        ASSESSMENT AND PLAN:   Health maintenance examination Reviewed age and gender appropriate health maintenance issues (prudent diet, regular exercise, health risks of tobacco and excessive alcohol, use of seatbelts, fire alarms in home, use of sunscreen).  Also reviewed age and gender appropriate health screening as well as vaccine recommendations. Discussed TLC for her weight today. Fasting cholesterol panel and TSH.   An After Visit Summary was printed and given to the patient.   FOLLOW UP:  Return in about 1 year (around 01/17/2013) for for CPE.

## 2012-01-18 NOTE — Patient Instructions (Signed)

## 2012-01-18 NOTE — Assessment & Plan Note (Signed)
Reviewed age and gender appropriate health maintenance issues (prudent diet, regular exercise, health risks of tobacco and excessive alcohol, use of seatbelts, fire alarms in home, use of sunscreen).  Also reviewed age and gender appropriate health screening as well as vaccine recommendations. Discussed TLC for her weight today. Fasting cholesterol panel and TSH.

## 2012-01-19 LAB — LIPID PANEL
HDL: 37.6 mg/dL — ABNORMAL LOW (ref >39.00)
Total CHOL/HDL Ratio: 4
Triglycerides: 155 mg/dL — ABNORMAL HIGH (ref 0.0–149.0)

## 2012-02-06 ENCOUNTER — Encounter: Payer: Self-pay | Admitting: Family Medicine

## 2012-05-30 ENCOUNTER — Encounter: Payer: Self-pay | Admitting: Family Medicine

## 2012-05-30 ENCOUNTER — Ambulatory Visit: Payer: 59 | Admitting: Family Medicine

## 2012-05-30 ENCOUNTER — Ambulatory Visit (INDEPENDENT_AMBULATORY_CARE_PROVIDER_SITE_OTHER): Payer: 59 | Admitting: Family Medicine

## 2012-05-30 VITALS — BP 126/84 | HR 82 | Temp 98.6°F | Ht 66.25 in | Wt 174.0 lb

## 2012-05-30 DIAGNOSIS — J069 Acute upper respiratory infection, unspecified: Secondary | ICD-10-CM

## 2012-05-30 NOTE — Patient Instructions (Addendum)
Buy behind the counter generic allegra D and follow dosing instructions on box. Buy OTC nasal saline spray and use 2-3 sprays in each nostril 3-4 times per day for irrigation and moisturization. For cough, buy OTC robitussin DM.

## 2012-05-30 NOTE — Progress Notes (Signed)
OFFICE NOTE  05/30/2012  CC:  Chief Complaint  Patient presents with  . Nasal Congestion    cough/congestion since at least Saturday (3/1)     HPI: Patient is a 37 y.o. Caucasian female who is here for "congestion". Pt presents complaining of respiratory symptoms for 6  days.  Primary symptoms are: ST initially but this is much improved, nasal congestion/PND, cough, lost voice some.  Worst symptoms seems to be the PND/congestion.  Lately the symptoms seem to be stable. Pertinent negatives: No fevers, no wheezing, and no SOB.  No pain in face or teeth.  No significant HA.  ST mild at most.   Symptoms made worse by laying supine, night time.  Symptoms improved by nothing (tried sudafed last night). Smoker? no Recent sick contact? unknown Muscle or joint aches? no Flu shot this season at least 2 wks ago? yes  Additional ROS: no n/v/d or abdominal pain.  No rash.  No neck stiffness.   +Mild fatigue.  +Mild appetite loss.   Pertinent PMH:  Past Medical History  Diagnosis Date  . Depression     after pregnancy loss was on zoloft  . Complication of anesthesia     leg weakness for 1 year after epidural in 2001  . AMA (advanced maternal age) multigravida 35+   . Hypertension     during pregnancy, + white coat (trial of enalapril  + ACE/HCTZ caused bp too low/sluggish.  Repeated home bp measurements normal.  . Anxiety     zoloft 25mg  helps a lot  . Pregnancy-induced hypertension, history with 1st preg 05/09/2011  . Lumbar spondylosis 07/27/11    with grade I spondylolisthesis (x-ray at chiropracter's office)    MEDS:  Outpatient Prescriptions Prior to Visit  Medication Sig Dispense Refill  . Calcium Carbonate-Vitamin D (CALCIUM 600/VITAMIN D) 600-400 MG-UNIT per chew tablet Chew 1 tablet by mouth daily.      Marland Kitchen CRANBERRY PO Take 2 tablets by mouth daily.      . Multiple Vitamins-Calcium (ONE-A-DAY WOMENS FORMULA PO) Take 1 tablet by mouth daily.      . Omega-3 Fatty Acids (FISH OIL  PO) Take 1 capsule by mouth daily.      . Probiotic Product (PROBIOTIC FORMULA PO) Take 1 capsule by mouth daily.      . sertraline (ZOLOFT) 25 MG tablet Take 25 mg by mouth daily.       No facility-administered medications prior to visit.    PE: Blood pressure 126/84, pulse 82, temperature 98.6 F (37 C), temperature source Temporal, height 5' 6.25" (1.683 m), weight 174 lb (78.926 kg), last menstrual period 05/08/2012, not currently breastfeeding. VS: noted--normal. Gen: alert, NAD, NONTOXIC APPEARING. HEENT: eyes without injection, drainage, or swelling.  Ears: EACs clear, TMs with normal light reflex and landmarks.  Nose: Clear rhinorrhea, with some dried, crusty exudate adherent to mildly injected mucosa.  No purulent d/c.  No paranasal sinus TTP.  No facial swelling.  Throat and mouth without focal lesion.  No pharyngial swelling, erythema, or exudate.   Neck: supple, no LAD.   LUNGS: CTA bilat, nonlabored resps.   CV: RRR, no m/r/g. EXT: no c/c/e SKIN: no rash    IMPRESSION AND PLAN:  Viral URI. Self-limited nature of this illness was discussed, questions answered.  Discussed symptomatic care, rest, fluids.   Warning signs/symptoms of worsening illness were discussed.  Patient instructed to call or return if any of these occur. Instructions: Buy behind the counter generic allegra D and  follow dosing instructions on box. Buy OTC nasal saline spray and use 2-3 sprays in each nostril 3-4 times per day for irrigation and moisturization. For cough, buy OTC robitussin DM.   FOLLOW UP: prn

## 2012-07-19 ENCOUNTER — Ambulatory Visit (INDEPENDENT_AMBULATORY_CARE_PROVIDER_SITE_OTHER): Payer: 59 | Admitting: Family Medicine

## 2012-07-19 ENCOUNTER — Encounter: Payer: Self-pay | Admitting: Family Medicine

## 2012-07-19 VITALS — BP 120/84 | HR 88 | Temp 98.2°F | Ht 66.5 in | Wt 177.5 lb

## 2012-07-19 DIAGNOSIS — J019 Acute sinusitis, unspecified: Secondary | ICD-10-CM

## 2012-07-19 MED ORDER — AZITHROMYCIN 250 MG PO TABS: ORAL_TABLET | ORAL | Status: DC

## 2012-07-19 NOTE — Patient Instructions (Signed)
Buy OTC nasal spray called Nasacort and use 2 sprays each nostril once daily. Use generic afrin nasal spray at bedtime for nasal congestion.

## 2012-07-19 NOTE — Progress Notes (Signed)
OFFICE NOTE  07/19/2012  CC:  Chief Complaint  Patient presents with  . Cough  . Sinusitis     HPI: Patient is a 37 y.o. Caucasian female who is here for respiratory illness. Describes 5 day hx of nasal congestion, runny nose w/PND, some ST initially but this has waned some, lately "sinus HA" worse and she feels run down and achy, has diffuse teeth pain.  No fever.  No signif coughing.  No n/v/d or rash.   Pertinent PMH:  Past Medical History  Diagnosis Date  . Depression     after pregnancy loss was on zoloft  . Complication of anesthesia     leg weakness for 1 year after epidural in 2001  . AMA (advanced maternal age) multigravida 35+   . Hypertension     during pregnancy, + white coat (trial of enalapril  + ACE/HCTZ caused bp too low/sluggish.  Repeated home bp measurements normal.  . Anxiety     zoloft 25mg  helps a lot  . Pregnancy-induced hypertension, history with 1st preg 05/09/2011  . Lumbar spondylosis 07/27/11    with grade I spondylolisthesis (x-ray at chiropracter's office)    MEDS:  Outpatient Prescriptions Prior to Visit  Medication Sig Dispense Refill  . Calcium Carbonate-Vitamin D (CALCIUM 600/VITAMIN D) 600-400 MG-UNIT per chew tablet Chew 1 tablet by mouth daily.      Marland Kitchen CRANBERRY PO Take 2 tablets by mouth daily.      . Multiple Vitamins-Calcium (ONE-A-DAY WOMENS FORMULA PO) Take 1 tablet by mouth daily.      . Omega-3 Fatty Acids (FISH OIL PO) Take 1 capsule by mouth daily.      . sertraline (ZOLOFT) 25 MG tablet Take 25 mg by mouth daily.      . Probiotic Product (PROBIOTIC FORMULA PO) Take 1 capsule by mouth daily.       No facility-administered medications prior to visit.   Past Surgical History  Procedure Laterality Date  . Cystectomy  2007    vulvar cyst  . Eye surgery      lasik  . Bartholin gland cyst excision      7-8 yrs ago  . Cholecystectomy  07/07/2011    Procedure: LAPAROSCOPIC CHOLECYSTECTOMY;  Surgeon: Dalia Heading, MD;  Location:  [facility];  Service: General;  Laterality: N/A;     PE: Blood pressure 120/84, pulse 88, temperature 98.2 F (36.8 C), temperature source Oral, height 5' 6.5" (1.689 m), weight 177 lb 8 oz (80.513 kg), last menstrual period 07/08/2012, SpO2 98.00%, not currently breastfeeding. VS: noted--normal. Gen: alert, NAD, NONTOXIC APPEARING. HEENT: eyes without injection, drainage, or swelling.  Ears: EACs clear, TMs with normal light reflex and landmarks.  Nose: Clear rhinorrhea, with some dried, crusty exudate adherent to mildly injected mucosa.  No purulent d/c.  No paranasal sinus TTP.  No facial swelling.  Throat and mouth without focal lesion.  No pharyngial swelling, erythema, or exudate.   Neck: supple, no LAD.   LUNGS: CTA bilat, nonlabored resps.   CV: RRR, no m/r/g. EXT: no c/c/e SKIN: no rash    IMPRESSION AND PLAN: Prolonged URI, continues to worsen. With start Z-pack.  Encouraged nasacort OTC 2 sprays each nostril qd. Encouraged trial of generic Afrin hs for nasal congestion. Tylenol/motrin prn.  FOLLOW UP: prn

## 2012-09-14 IMAGING — US US ABDOMEN COMPLETE
1 series · 14 of 25 positions shown · non-contrast
Comparison: None.

CLINICAL DATA: Right upper abdominal pain

COMPLETE ABDOMINAL ULTRASOUND

[Series 1: us abdomen complete · 0.21mm/px · 14 of 101 slices shown]
[im 1/101]
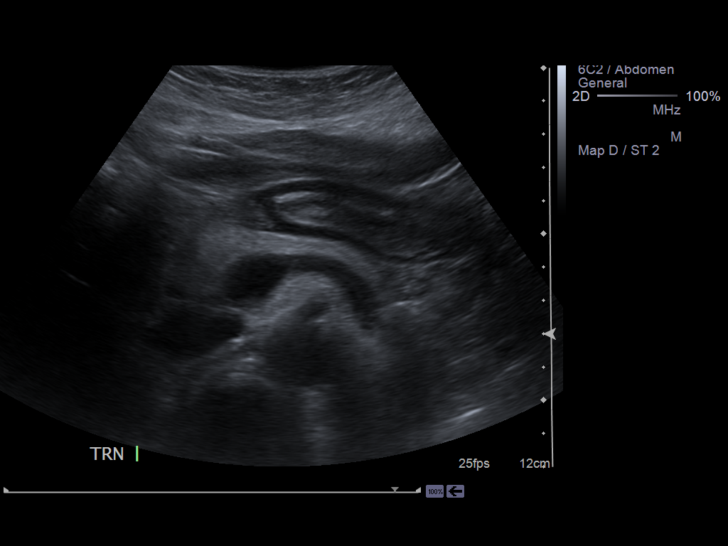
[im 9/101]
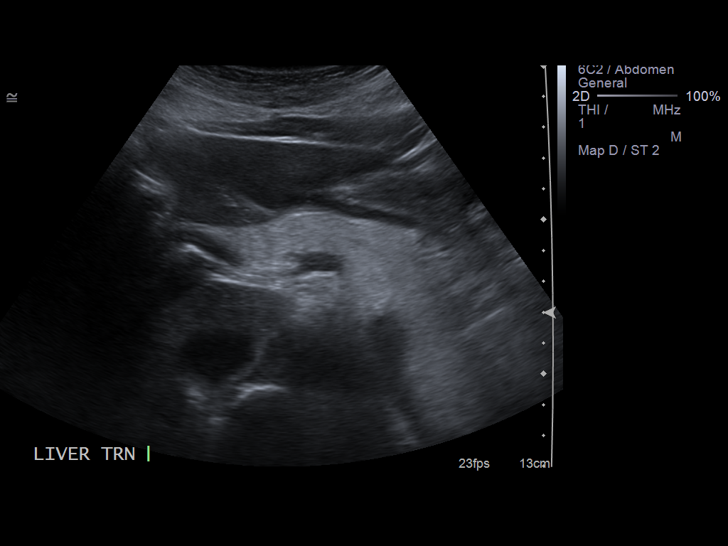
[im 17/101]
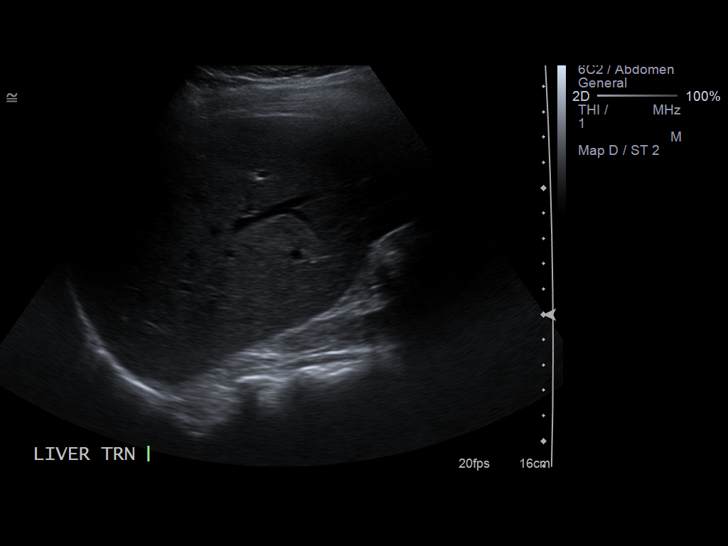
[im 26/101]
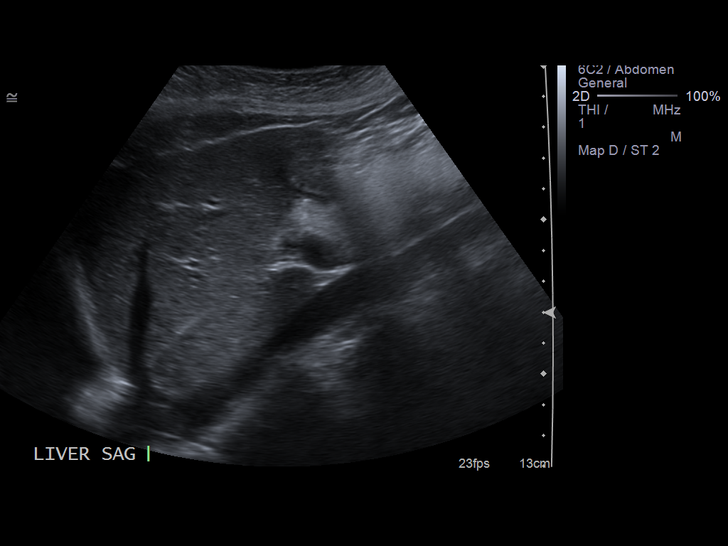
[im 34/101]
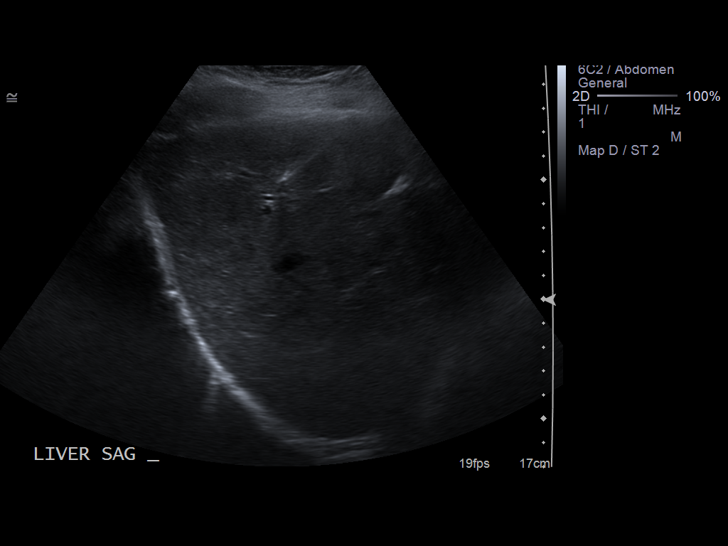
[im 38/101]
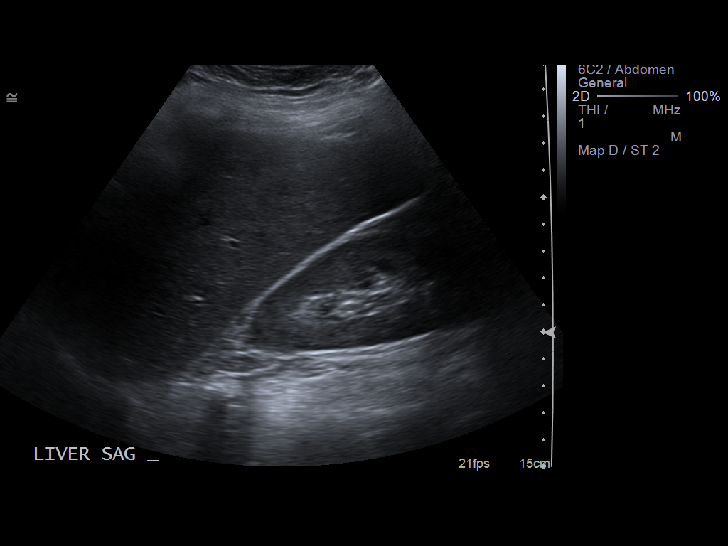
[im 46/101]
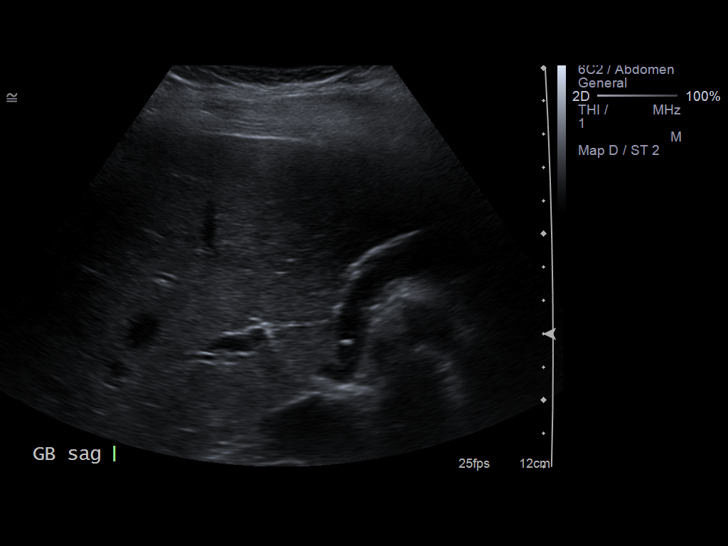
[im 55/101]
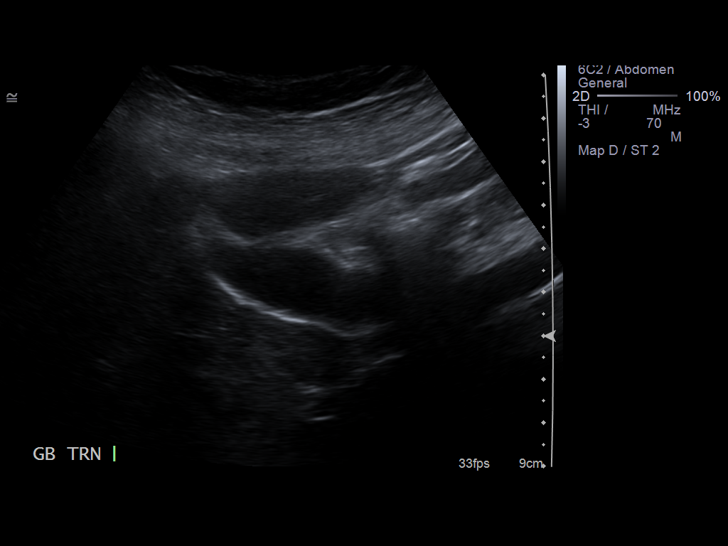
[im 63/101]
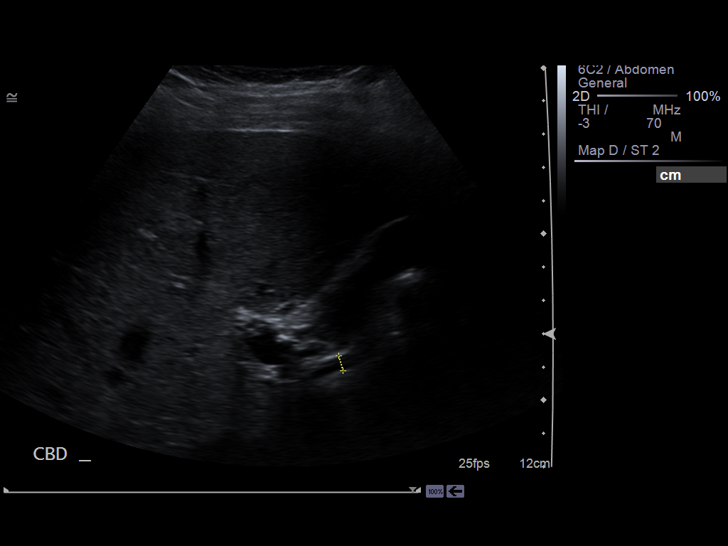
[im 67/101]
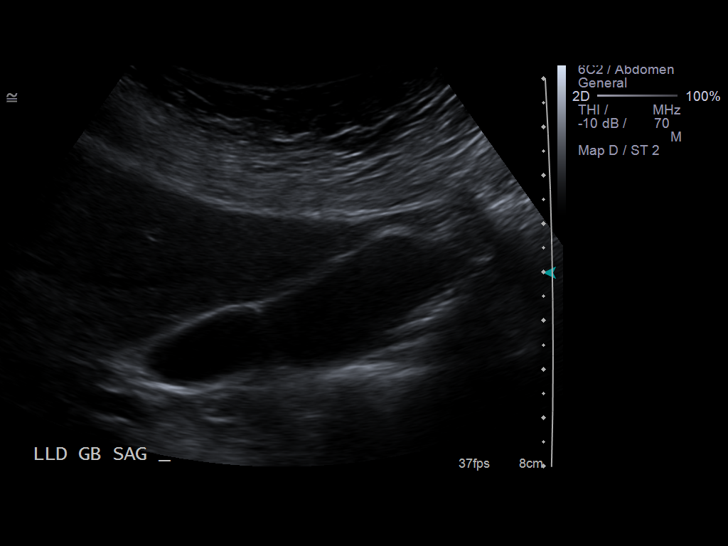
[im 76/101]
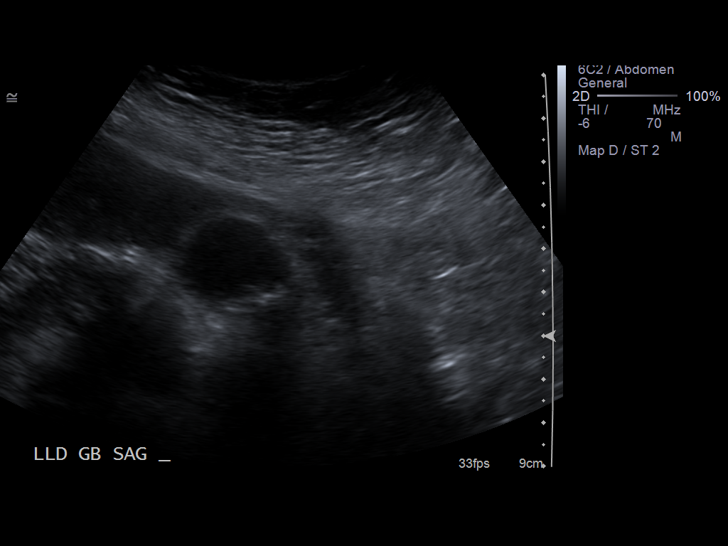
[im 84/101]
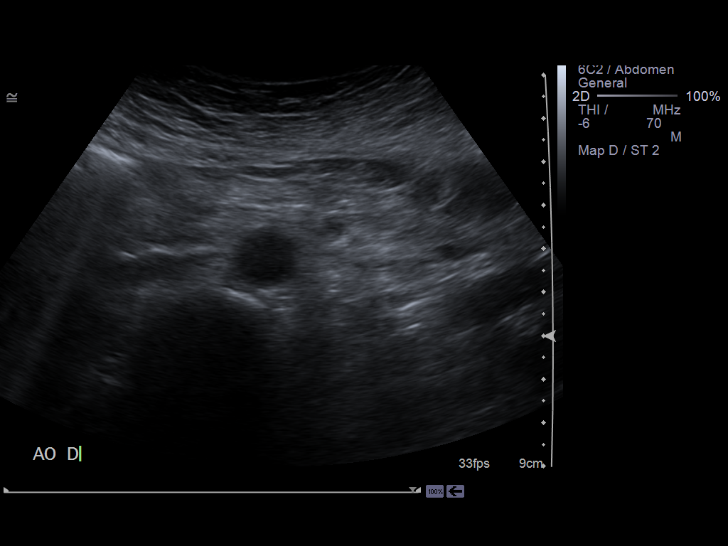
[im 92/101]
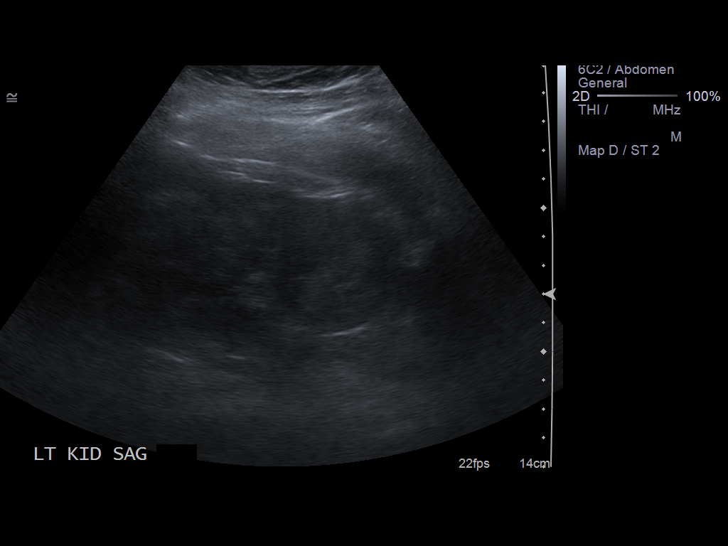
[im 101/101]
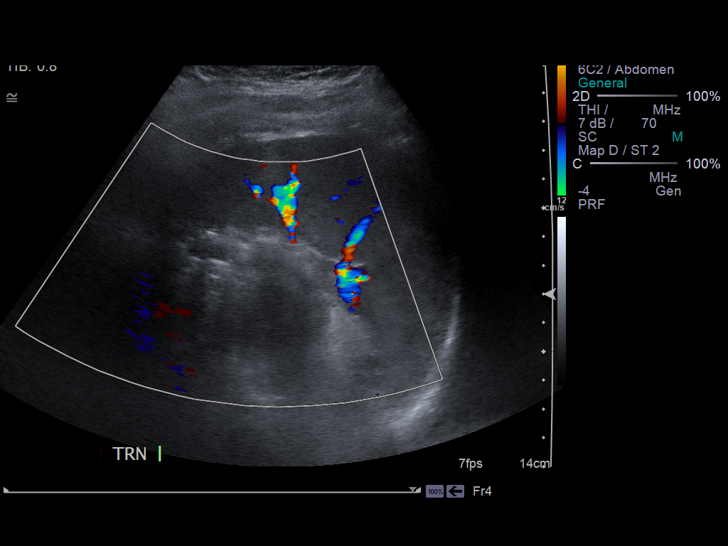

[14 of 25 positions shown; findings below may reference images not displayed]

FINDINGS: Gallbladder:  Innumerable tiny stones layer in the dependent aspect
of the gallbladder.  Borderline gallbladder wall thickening up to
3.3 mm.  No pericholecystic fluid.  Sonographer reports no
sonographic Murphy's sign.

Common bile duct:  5.4 mm, unremarkable

Liver:  No focal lesion identified.  Within normal limits in
parenchymal echogenicity.

IVC:  Appears normal.

Pancreas:  No focal abnormality seen.

Spleen:  8.9 cm craniocaudal length, unremarkable

Right Kidney:  10.1 cm. No hydronephrosis.  Well-preserved cortex.
Normal size and parenchymal echotexture without focal
abnormalities.

Left Kidney:  10.8 cm. No hydronephrosis.  Well-preserved cortex.
Normal size and parenchymal echotexture without focal
abnormalities.

Abdominal aorta:  No aneurysm identified.
IMPRESSION: 1.  Cholelithiasis with borderline gallbladder wall thickening.

## 2012-11-18 NOTE — Progress Notes (Signed)
.. Subjective:    Mary Gross is a 37y.o. MW female, P46, who presents for an annual exam.  BP check at [facility] 2d ago=139/96.  The patient reports:no complaints Contraception:no method--undecided on future pregnancies. Last mammogram: not applicable  Last pap: was normal November 14, 2010  GC/Chlamydia cultures offered: declined  HIV/RPR/HbsAg offered: declined  HSV 1 and 2 glycoprotein offered: declined  Menstrual cycle regular and monthly: Yes  Menstrual flow normal: Yes  Urinary symptoms: none  Normal bowel movements: Yes  Reports abuse at home: No:     History   Social History  . Marital Status: Married    Spouse Name: N/A    Number of Children: N/A  . Years of Education: N/A   Social History Main Topics  . Smoking status: Never Smoker   . Smokeless tobacco: Never Used  . Alcohol Use: No  . Drug Use: No  . Sexual Activity: Yes    Partners: Male    Birth Control/ Protection: None   Other Topics Concern  . None   Social History Narrative   Married, 3 children --93 y/o, 4 y/o, and 72 mo old.   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   Stay at home mom.   Husband works in Consulting civil engineer at [employer] in [place].   No T/A/Ds.   Exercise--"chasing kids around".  Normal american diet.        .. Allergies  Allergen Reactions  . Hctz [Hydrochlorothiazide] Other (See Comments)    Malaise/HA/orthostatic sx's  . Codeine Rash  . Iron Rash  . Penicillins Rash  . Sulfa Antibiotics Rash  .Marland Kitchen Past Medical History  Diagnosis Date  . Depression     after pregnancy loss was on zoloft  . Complication of anesthesia     leg weakness for 1 year after epidural in 2001  . AMA (advanced maternal age) multigravida 35+   . Hypertension     during pregnancy, + white coat (trial of enalapril  + ACE/HCTZ caused bp too low/sluggish.  Repeated home bp measurements normal.  . Anxiety     zoloft 25mg  helps a lot  . Pregnancy-induced hypertension, history with 1st preg 05/09/2011  . Lumbar  spondylosis 07/27/11    with grade I spondylolisthesis (x-ray at chiropracter's office)  .Marland Kitchen Past Surgical History  Procedure Laterality Date  . Cystectomy  2007    vulvar cyst  . Eye surgery      lasik  . Bartholin gland cyst excision      7-8 yrs ago  . Cholecystectomy  07/07/2011    Procedure: LAPAROSCOPIC CHOLECYSTECTOMY;  Surgeon: Dalia Heading, MD;  Location: [facility];  Service: General;  Laterality: N/A;    Menstrual cycle:   LMP: Patient's last menstrual period was 12/07/2011.           Cycle: monthly  The following portions of the patient's history were reviewed and updated as appropriate: allergies, current medications, past family history, past medical history, past social history, past surgical history and problem list.  Review of Systems Pertinent items are noted in HPI. Breast:Negative for breast lump,nipple discharge or nipple retraction Gastrointestinal: Negative for abdominal pain, change in bowel habits or rectal bleeding Urinary:negative   Objective:    BP 110/80  Resp 16  Wt 180 lb (81.647 kg)  BMI 29.07 kg/m2  LMP 12/07/2011  Breastfeeding? No    Weight:  Wt Readings from Last 1 Encounters:  07/19/12 177 lb 8 oz (80.513 kg)  BMI: Body mass index is 29.07 kg/(m^2).  General Appearance: Alert, appropriate appearance for age. No acute distress HEENT: Grossly normal Neck / Thyroid: Supple, no masses, nodes or enlargement Lungs: clear to auscultation bilaterally Back: No CVA tenderness Breast Exam: No dimpling, nipple retraction or discharge. No masses or nodes. and No masses or nodes.No dimpling, nipple retraction or discharge. Cardiovascular: Regular rate and rhythm.  Gastrointestinal: Soft, non-tender, no masses or organomegaly Pelvic Exam: Vulva and vagina appear normal. Bimanual exam reveals normal uterus and adnexa. Rectovaginal: not indicated Lymphatic Exam: Non-palpable nodes in neck, clavicular, axillary,   Skin: no rash or  abnormalities Neurologic: Normal gait and speech, no tremor  Psychiatric: Alert and oriented, appropriate affect.   Wet Prep:not applicable Urinalysis:not applicable UPT: Not done   Assessment:    Normal gyn exam   Likely CHTN w/ h/o PIH Increased BMI  Plan:    Mammogram--due age 74 pap smear done, next pap due 2014-2015 return annually or prn; f/u w/ PCP for lipid panel, DM screen, and HBP STD screening: declined Contraception:no method rec'd daily vit, kegels, sunscreen, healthy diet/ mod exercise at least 5d/week/ wt loss to help w/ HBP  C. Denny Levy, CNM Late entry from 01/03/12

## 2012-12-20 ENCOUNTER — Telehealth: Payer: Self-pay | Admitting: Family Medicine

## 2012-12-20 MED ORDER — SERTRALINE HCL 25 MG PO TABS: 25.0000 mg | ORAL_TABLET | Freq: Every day | ORAL | Status: DC

## 2012-12-20 NOTE — Telephone Encounter (Signed)
Sent in 30 day supply x 1 refill to get patient through until her next appointment which should be made for October.

## 2012-12-26 ENCOUNTER — Ambulatory Visit: Payer: 59

## 2012-12-26 ENCOUNTER — Ambulatory Visit (INDEPENDENT_AMBULATORY_CARE_PROVIDER_SITE_OTHER): Payer: 59

## 2012-12-26 DIAGNOSIS — Z23 Encounter for immunization: Secondary | ICD-10-CM

## 2012-12-27 ENCOUNTER — Ambulatory Visit: Payer: 59

## 2013-02-03 ENCOUNTER — Other Ambulatory Visit: Payer: Self-pay | Admitting: Family Medicine

## 2013-02-03 MED ORDER — SERTRALINE HCL 50 MG PO TABS: 50.0000 mg | ORAL_TABLET | Freq: Every day | ORAL | Status: DC

## 2013-02-10 ENCOUNTER — Other Ambulatory Visit: Payer: Self-pay | Admitting: Family Medicine

## 2013-05-12 ENCOUNTER — Telehealth: Payer: Self-pay | Admitting: Family Medicine

## 2013-05-12 NOTE — Telephone Encounter (Signed)
error 

## 2013-06-24 ENCOUNTER — Other Ambulatory Visit: Payer: Self-pay | Admitting: Family Medicine

## 2013-06-24 MED ORDER — SERTRALINE HCL 50 MG PO TABS: 50.0000 mg | ORAL_TABLET | Freq: Every day | ORAL | Status: DC

## 2013-11-30 ENCOUNTER — Other Ambulatory Visit: Payer: Self-pay | Admitting: Family Medicine

## 2013-12-31 ENCOUNTER — Other Ambulatory Visit: Payer: Self-pay | Admitting: Family Medicine

## 2014-01-15 ENCOUNTER — Telehealth: Payer: Self-pay | Admitting: *Deleted

## 2014-01-15 ENCOUNTER — Other Ambulatory Visit: Payer: Self-pay | Admitting: Family Medicine

## 2014-01-15 NOTE — Telephone Encounter (Signed)
Patient called office requesting refill on sertraline. Explained to patient that she has not had an ov since 07/19/2012 for acute visit and her last cpe was 01/18/2012. Informed pt that she will need an appt for further refills. Patient scheduled an appt for 01/21/2014 at 10:00 am. Patient requested that we send an rx for sertraline before her appt. Please advise?

## 2014-01-18 MED ORDER — SERTRALINE HCL 50 MG PO TABS: ORAL_TABLET | ORAL | Status: DC

## 2014-01-18 NOTE — Telephone Encounter (Signed)
Good plan. Sertraline rx 30d supply with no RF's sent to pt's pharmacy per her request. -thx

## 2014-01-21 ENCOUNTER — Ambulatory Visit (INDEPENDENT_AMBULATORY_CARE_PROVIDER_SITE_OTHER): Payer: 59 | Admitting: Family Medicine

## 2014-01-21 ENCOUNTER — Encounter: Payer: Self-pay | Admitting: Family Medicine

## 2014-01-21 VITALS — BP 117/83 | HR 82 | Temp 98.2°F | Resp 16 | Ht 66.5 in | Wt 186.0 lb

## 2014-01-21 DIAGNOSIS — Z23 Encounter for immunization: Secondary | ICD-10-CM

## 2014-01-21 DIAGNOSIS — Z0000 Encounter for general adult medical examination without abnormal findings: Secondary | ICD-10-CM

## 2014-01-21 LAB — CBC WITH DIFFERENTIAL/PLATELET
BASOS ABS: 0 10*3/uL (ref 0.0–0.1)
BASOS PCT: 0.6 % (ref 0.0–3.0)
EOS ABS: 0.1 10*3/uL (ref 0.0–0.7)
Eosinophils Relative: 1.5 % (ref 0.0–5.0)
HEMATOCRIT: 41.2 % (ref 36.0–46.0)
HEMOGLOBIN: 13.8 g/dL (ref 12.0–15.0)
LYMPHS ABS: 2 10*3/uL (ref 0.7–4.0)
LYMPHS PCT: 27.9 % (ref 12.0–46.0)
MCHC: 33.6 g/dL (ref 30.0–36.0)
MCV: 89.2 fl (ref 78.0–100.0)
MONOS PCT: 5.7 % (ref 3.0–12.0)
Monocytes Absolute: 0.4 10*3/uL (ref 0.1–1.0)
NEUTROS ABS: 4.7 10*3/uL (ref 1.4–7.7)
Neutrophils Relative %: 64.3 % (ref 43.0–77.0)
Platelets: 285 10*3/uL (ref 150.0–400.0)
RBC: 4.62 Mil/uL (ref 3.87–5.11)
RDW: 13.9 % (ref 11.5–15.5)
WBC: 7.3 10*3/uL (ref 4.0–10.5)

## 2014-01-21 LAB — POCT URINALYSIS DIPSTICK
BILIRUBIN UA: NEGATIVE
GLUCOSE UA: NEGATIVE
KETONES UA: NEGATIVE
NITRITE UA: NEGATIVE
PH UA: 7
Protein, UA: NEGATIVE
Spec Grav, UA: 1.02
Urobilinogen, UA: 0.2

## 2014-01-21 LAB — COMPREHENSIVE METABOLIC PANEL
ALT: 11 U/L (ref 0–35)
AST: 17 U/L (ref 0–37)
Albumin: 3.8 g/dL (ref 3.5–5.2)
Alkaline Phosphatase: 61 U/L (ref 39–117)
BUN: 12 mg/dL (ref 6–23)
CALCIUM: 9.7 mg/dL (ref 8.4–10.5)
CHLORIDE: 104 meq/L (ref 96–112)
CO2: 23 meq/L (ref 19–32)
CREATININE: 0.8 mg/dL (ref 0.4–1.2)
GFR: 80.3 mL/min (ref >60.00)
GLUCOSE: 86 mg/dL (ref 70–99)
Potassium: 4.2 mEq/L (ref 3.5–5.1)
Sodium: 138 mEq/L (ref 135–145)
Total Bilirubin: 1 mg/dL (ref 0.2–1.2)
Total Protein: 7.4 g/dL (ref 6.0–8.3)

## 2014-01-21 LAB — LIPID PANEL
CHOL/HDL RATIO: 3
Cholesterol: 164 mg/dL (ref 0–200)
HDL: 48.7 mg/dL (ref >39.00)
LDL Cholesterol: 95 mg/dL (ref 0–99)
NonHDL: 115.3
TRIGLYCERIDES: 103 mg/dL (ref 0.0–149.0)
VLDL: 20.6 mg/dL (ref 0.0–40.0)

## 2014-01-21 LAB — TSH: TSH: 2.73 u[IU]/mL (ref 0.35–4.50)

## 2014-01-21 MED ORDER — SERTRALINE HCL 50 MG PO TABS: ORAL_TABLET | ORAL | Status: DC

## 2014-01-21 NOTE — Patient Instructions (Signed)
Back Exercises These exercises may help you when beginning to rehabilitate your injury. Your symptoms may resolve with or without further involvement from your physician, physical therapist or athletic trainer. While completing these exercises, remember:   Restoring tissue flexibility helps normal motion to return to the joints. This allows healthier, less painful movement and activity.  An effective stretch should be held for at least 30 seconds.  A stretch should never be painful. You should only feel a gentle lengthening or release in the stretched tissue. STRETCH - Extension, Prone on Elbows   Lie on your stomach on the floor, a bed will be too soft. Place your palms about shoulder width apart and at the height of your head.  Place your elbows under your shoulders. If this is too painful, stack pillows under your chest.  Allow your body to relax so that your hips drop lower and make contact more completely with the floor.  Hold this position for __________ seconds.  Slowly return to lying flat on the floor. Repeat __________ times. Complete this exercise __________ times per day.  RANGE OF MOTION - Extension, Prone Press Ups   Lie on your stomach on the floor, a bed will be too soft. Place your palms about shoulder width apart and at the height of your head.  Keeping your back as relaxed as possible, slowly straighten your elbows while keeping your hips on the floor. You may adjust the placement of your hands to maximize your comfort. As you gain motion, your hands will come more underneath your shoulders.  Hold this position __________ seconds.  Slowly return to lying flat on the floor. Repeat __________ times. Complete this exercise __________ times per day.  RANGE OF MOTION- Quadruped, Neutral Spine   Assume a hands and knees position on a firm surface. Keep your hands under your shoulders and your knees under your hips. You may place padding under your knees for  comfort.  Drop your head and point your tail bone toward the ground below you. This will round out your low back like an angry cat. Hold this position for __________ seconds.  Slowly lift your head and release your tail bone so that your back sags into a large arch, like an old horse.  Hold this position for __________ seconds.  Repeat this until you feel limber in your low back.  Now, find your "sweet spot." This will be the most comfortable position somewhere between the two previous positions. This is your neutral spine. Once you have found this position, tense your stomach muscles to support your low back.  Hold this position for __________ seconds. Repeat __________ times. Complete this exercise __________ times per day.  STRETCH - Flexion, Single Knee to Chest   Lie on a firm bed or floor with both legs extended in front of you.  Keeping one leg in contact with the floor, bring your opposite knee to your chest. Hold your leg in place by either grabbing behind your thigh or at your knee.  Pull until you feel a gentle stretch in your low back. Hold __________ seconds.  Slowly release your grasp and repeat the exercise with the opposite side. Repeat __________ times. Complete this exercise __________ times per day.  STRETCH - Hamstrings, Standing  Stand or sit and extend your right / left leg, placing your foot on a chair or foot stool  Keeping a slight arch in your low back and your hips straight forward.  Lead with your chest and   lean forward at the waist until you feel a gentle stretch in the back of your right / left knee or thigh. (When done correctly, this exercise requires leaning only a small distance.)  Hold this position for __________ seconds. Repeat __________ times. Complete this stretch __________ times per day. STRENGTHENING - Deep Abdominals, Pelvic Tilt   Lie on a firm bed or floor. Keeping your legs in front of you, bend your knees so they are both pointed  toward the ceiling and your feet are flat on the floor.  Tense your lower abdominal muscles to press your low back into the floor. This motion will rotate your pelvis so that your tail bone is scooping upwards rather than pointing at your feet or into the floor.  With a gentle tension and even breathing, hold this position for __________ seconds. Repeat __________ times. Complete this exercise __________ times per day.  STRENGTHENING - Abdominals, Crunches   Lie on a firm bed or floor. Keeping your legs in front of you, bend your knees so they are both pointed toward the ceiling and your feet are flat on the floor. Cross your arms over your chest.  Slightly tip your chin down without bending your neck.  Tense your abdominals and slowly lift your trunk high enough to just clear your shoulder blades. Lifting higher can put excessive stress on the low back and does not further strengthen your abdominal muscles.  Control your return to the starting position. Repeat __________ times. Complete this exercise __________ times per day.  STRENGTHENING - Quadruped, Opposite UE/LE Lift   Assume a hands and knees position on a firm surface. Keep your hands under your shoulders and your knees under your hips. You may place padding under your knees for comfort.  Find your neutral spine and gently tense your abdominal muscles so that you can maintain this position. Your shoulders and hips should form a rectangle that is parallel with the floor and is not twisted.  Keeping your trunk steady, lift your right hand no higher than your shoulder and then your left leg no higher than your hip. Make sure you are not holding your breath. Hold this position __________ seconds.  Continuing to keep your abdominal muscles tense and your back steady, slowly return to your starting position. Repeat with the opposite arm and leg. Repeat __________ times. Complete this exercise __________ times per day. Document Released:  03/31/2005 Document Revised: 06/05/2011 Document Reviewed: 06/25/2008 Exit[employer] Patient Information 2015 [employer]. This information is not intended to replace advice given to you by your health care provider. Make sure you discuss any questions you have with your health care provider.  

## 2014-01-21 NOTE — Assessment & Plan Note (Signed)
Reviewed age and gender appropriate health maintenance issues (prudent diet, regular exercise, health risks of tobacco and excessive alcohol, use of seatbelts, fire alarms in home, use of sunscreen).  Also reviewed age and gender appropriate health screening as well as vaccine recommendations. Flu vaccine IM today. HP labs drawn.  UA normal (asymptomatic). Discussed general therapeutic measures to help diminish her episodic mechanical LBP: stretches (handout given), core strengthening, + CV exercise/diet for wt loss.

## 2014-01-21 NOTE — Progress Notes (Signed)
Office Note 01/21/2014  CC:  Chief Complaint  Patient presents with  . Annual Exam  . Dysuria   HPI:  Mary Gross is a 38 y.o. White female who is here for annual CPE : she has no urinary complaint today but asked to give urine sample "just to make sure it checks out ok".   Feels well on current med: sertraline 50mg  qhs. She is fasting today. Lately she is trying to exercise more, trying to eat healthier.   Past Medical History  Diagnosis Date  . Depression     after pregnancy loss was on zoloft  . Complication of anesthesia     leg weakness for 1 year after epidural in 2001  . AMA (advanced maternal age) multigravida 35+   . Hypertension     during pregnancy, + white coat (trial of enalapril  + ACE/HCTZ caused bp too low/sluggish.  Repeated home bp measurements normal.  . Anxiety     zoloft 25mg  helps a lot  . Pregnancy-induced hypertension, history with 1st preg 05/09/2011  . Lumbar spondylosis 07/27/11    with grade I spondylolisthesis (x-ray at chiropracter's office)    Past Surgical History  Procedure Laterality Date  . Cystectomy  2007    vulvar cyst  . Eye surgery      lasik  . Bartholin gland cyst excision      7-8 yrs ago  . Cholecystectomy  07/07/2011    Procedure: LAPAROSCOPIC CHOLECYSTECTOMY;  Surgeon: Dalia HeadingMark A Jenkins, MD;  Location: [facility];  Service: General;  Laterality: N/A;    Family History  Problem Relation Age of Onset  . Peripheral vascular disease Mother   . Depression Mother   . Hypertension Father   . Kidney disease Father     Bright's Disease as a child  . Hypertension Paternal Aunt   . Thyroid disease Maternal Grandmother   . Cancer Maternal Grandmother     bladder   . Stroke Paternal Grandmother   . Parkinsonism Paternal Grandmother   . Stroke Paternal Grandfather   . Anesthesia problems Neg Hx   . Hypotension Neg Hx   . Malignant hyperthermia Neg Hx   . Pseudochol deficiency Neg Hx   . Hypertension Maternal Uncle      History   Social History  . Marital Status: Married    Spouse Name: N/A    Number of Children: N/A  . Years of Education: N/A   Occupational History  . Not on file.   Social History Main Topics  . Smoking status: Never Smoker   . Smokeless tobacco: Never Used  . Alcohol Use: No  . Drug Use: No  . Sexual Activity: Yes    Partners: Male    Birth Control/ Protection: None   Other Topics Concern  . Not on file   Social History Narrative   Married, 3 children --1 son and 2 daughters.   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   Stay at home mom.   Husband works in Consulting civil engineerT at [employer] in [place].   No T/A/Ds.   Exercise--"chasing kids around".  Normal american diet.             Outpatient Prescriptions Prior to Visit  Medication Sig Dispense Refill  . Calcium Carbonate-Vitamin D (CALCIUM 600/VITAMIN D) 600-400 MG-UNIT per chew tablet Chew 1 tablet by mouth daily.      . Multiple Vitamins-Calcium (ONE-A-DAY WOMENS FORMULA PO) Take 1 tablet by mouth daily.      . Omega-3 Fatty  Acids (FISH OIL PO) Take 1 capsule by mouth daily.      . Probiotic Product (PROBIOTIC FORMULA PO) Take 1 capsule by mouth daily.      Marland Kitchen. CRANBERRY PO Take 2 tablets by mouth daily.      . sertraline (ZOLOFT) 50 MG tablet TAKE 1 TABLET BY MOUTH EVERY DAY  30 tablet  0  . azithromycin (ZITHROMAX) 250 MG tablet 2 tabs po qd x 1d, then 1 tab po qd x 4d  6 each  0   No facility-administered medications prior to visit.    Allergies  Allergen Reactions  . Hctz [Hydrochlorothiazide] Other (See Comments)    Malaise/HA/orthostatic sx's  . Codeine Rash  . Iron Rash  . Penicillins Rash  . Sulfa Antibiotics Rash    ROS Review of Systems  Constitutional: Negative for fever, chills, appetite change and fatigue.  HENT: Negative for congestion, dental problem, ear pain and sore throat.   Eyes: Negative for discharge, redness and visual disturbance.  Respiratory: Negative for cough, chest tightness, shortness  of breath and wheezing.   Cardiovascular: Negative for chest pain, palpitations and leg swelling.  Gastrointestinal: Negative for nausea, vomiting, abdominal pain, diarrhea and blood in stool.  Genitourinary: Negative for dysuria, urgency, frequency, hematuria, flank pain and difficulty urinating.  Musculoskeletal: Positive for back pain (recurrent mechanical low back pain). Negative for arthralgias, joint swelling, myalgias and neck stiffness.  Skin: Negative for pallor and rash.  Neurological: Negative for dizziness, speech difficulty, weakness and headaches.  Hematological: Negative for adenopathy. Does not bruise/bleed easily.  Psychiatric/Behavioral: Negative for confusion and sleep disturbance. The patient is not nervous/anxious.     PE; Blood pressure 117/83, pulse 82, temperature 98.2 F (36.8 C), temperature source Temporal, resp. rate 16, height 5' 6.5" (1.689 m), weight 186 lb (84.369 kg), SpO2 99.00%. Gen: Alert, well appearing.  Patient is oriented to person, place, time, and situation. AFFECT: pleasant, lucid thought and speech. ENT: Ears: EACs clear, normal epithelium.  TMs with good light reflex and landmarks bilaterally.  Eyes: no injection, icteris, swelling, or exudate.  EOMI, PERRLA. Nose: no drainage or turbinate edema/swelling.  No injection or focal lesion.  Mouth: lips without lesion/swelling.  Oral mucosa pink and moist.  Dentition intact and without obvious caries or gingival swelling.  Oropharynx without erythema, exudate, or swelling.  Neck: supple/nontender.  No LAD, mass, or TM.  Carotid pulses 2+ bilaterally, without bruits. CV: RRR, no m/r/g.   LUNGS: CTA bilat, nonlabored resps, good aeration in all lung fields. ABD: soft, NT, ND, BS normal.  No hepatospenomegaly or mass.  No bruits. EXT: no clubbing, cyanosis, or edema.  Musculoskeletal: no joint swelling, erythema, warmth, or tenderness.  ROM of all joints intact. Skin - no sores or suspicious lesions or  rashes or color changes  Pertinent labs:  CC UA today: trace-intact blood, trace LEU, otherwise normal  ASSESSMENT AND PLAN:   Health maintenance examination Reviewed age and gender appropriate health maintenance issues (prudent diet, regular exercise, health risks of tobacco and excessive alcohol, use of seatbelts, fire alarms in home, use of sunscreen).  Also reviewed age and gender appropriate health screening as well as vaccine recommendations. Flu vaccine IM today. HP labs drawn.  UA normal (asymptomatic). Discussed general therapeutic measures to help diminish her episodic mechanical LBP: stretches (handout given), core strengthening, + CV exercise/diet for wt loss.   An After Visit Summary was printed and given to the patient.  FOLLOW UP:  Return in about 1  year (around 01/22/2015) for annual CPE (fasting).

## 2014-01-21 NOTE — Progress Notes (Signed)
Pre visit review using our clinic review tool, if applicable. No additional management support is needed unless otherwise documented below in the visit note. 

## 2014-01-26 ENCOUNTER — Encounter: Payer: Self-pay | Admitting: Family Medicine

## 2014-03-08 ENCOUNTER — Encounter (HOSPITAL_COMMUNITY): Payer: Self-pay | Admitting: Emergency Medicine

## 2014-03-08 ENCOUNTER — Emergency Department (HOSPITAL_COMMUNITY)
Admission: EM | Admit: 2014-03-08 | Discharge: 2014-03-08 | Disposition: A | Discharge status: Home / Self Care | Payer: 59 | Attending: Emergency Medicine | Admitting: Emergency Medicine

## 2014-03-08 DIAGNOSIS — R21 Rash and other nonspecific skin eruption: Secondary | ICD-10-CM | POA: Diagnosis present

## 2014-03-08 DIAGNOSIS — F419 Anxiety disorder, unspecified: Secondary | ICD-10-CM | POA: Insufficient documentation

## 2014-03-08 DIAGNOSIS — F329 Major depressive disorder, single episode, unspecified: Secondary | ICD-10-CM | POA: Diagnosis not present

## 2014-03-08 DIAGNOSIS — Z79899 Other long term (current) drug therapy: Secondary | ICD-10-CM | POA: Insufficient documentation

## 2014-03-08 DIAGNOSIS — L259 Unspecified contact dermatitis, unspecified cause: Secondary | ICD-10-CM | POA: Insufficient documentation

## 2014-03-08 DIAGNOSIS — Z8739 Personal history of other diseases of the musculoskeletal system and connective tissue: Secondary | ICD-10-CM | POA: Diagnosis not present

## 2014-03-08 DIAGNOSIS — Z7952 Long term (current) use of systemic steroids: Secondary | ICD-10-CM | POA: Diagnosis not present

## 2014-03-08 DIAGNOSIS — Z880 Allergy status to penicillin: Secondary | ICD-10-CM | POA: Diagnosis not present

## 2014-03-08 DIAGNOSIS — I10 Essential (primary) hypertension: Secondary | ICD-10-CM | POA: Diagnosis not present

## 2014-03-08 DIAGNOSIS — L239 Allergic contact dermatitis, unspecified cause: Secondary | ICD-10-CM

## 2014-03-08 MED ORDER — PREDNISONE 10 MG PO TABS: 20.0000 mg | ORAL_TABLET | Freq: Two times a day (BID) | ORAL | Status: DC

## 2014-03-08 MED ORDER — FAMOTIDINE 20 MG PO TABS: 20.0000 mg | ORAL_TABLET | Freq: Two times a day (BID) | ORAL | Status: DC

## 2014-03-08 NOTE — ED Notes (Signed)
Patient c/o rash that itches and burns. Per patient "broke out suddenly with rash while driving." Reports feeling hot suddenly and then noticing face was red with rash. Per patient rash on face, neck, and upper back. Per patient took oral benadryl x2 which has seemed to "helped some." Patient denies any new medications, food, or soap. Per patient did open box today with something she ordered online. Per patient package had a "funny odor." Denies any difficulty breathing or swelling.

## 2014-03-08 NOTE — Discharge Instructions (Signed)
Continue to take the Benadryl in addition to the other medications.

## 2014-03-08 NOTE — ED Provider Notes (Signed)
CSN: [id number]     Arrival date & time 03/08/14  1252 History  This chart was scribed for non-physician practitioner, Kerrie Buffalo, FNP,working with Vida Roller, MD, by Karle Plumber, ED Scribe. This patient was seen in room [facility] and the patient's care was started at 1:56 PM.  Chief Complaint  Patient presents with  . Rash   Patient is a 38 y.o. female presenting with rash. The history is provided by the patient. No language interpreter was used.  Rash Associated symptoms: no fever, no nausea, no shortness of breath and not vomiting     HPI Comments:  Mary Gross is a 38 y.o. female who presents to the Emergency Department complaining of a burning, itching rash that began suddenly about two hours ago. She reports that she was driving down the street and her face suddenly broke out in the rash. She states the rash is also on her neck and upper back. She states the only thing she has done differently was open a package she ordered online and states it had a "musty" smell. She reports wiping her face with a cool rag and taking two OTC Benadryl with significant relief. She reports allergic reactions in the past. Denies any new lotion, creams, soaps, detergents or hygiene product. Denies SOB, fever, chills, nausea, vomiting or trouble swallowing. PMH of depression, anxiety, HTN and lumbar spondylosis.  Past Medical History  Diagnosis Date  . Depression     after pregnancy loss was on zoloft  . Complication of anesthesia     leg weakness for 1 year after epidural in 2001  . AMA (advanced maternal age) multigravida 35+   . Hypertension     during pregnancy, + white coat (trial of enalapril  + ACE/HCTZ caused bp too low/sluggish.  Repeated home bp measurements normal.  . Anxiety     zoloft 25mg  helps a lot  . Pregnancy-induced hypertension, history with 1st preg 05/09/2011  . Lumbar spondylosis 07/27/11    with grade I spondylolisthesis (x-ray at chiropracter's office)   Past  Surgical History  Procedure Laterality Date  . Cystectomy  2007    vulvar cyst  . Eye surgery      lasik  . Bartholin gland cyst excision      7-8 yrs ago  . Cholecystectomy  07/07/2011    Procedure: LAPAROSCOPIC CHOLECYSTECTOMY;  Surgeon: Dalia Heading, MD;  Location: [facility];  Service: General;  Laterality: N/A;   Family History  Problem Relation Age of Onset  . Peripheral vascular disease Mother   . Depression Mother   . Hypertension Father   . Kidney disease Father     Bright's Disease as a child  . Hypertension Paternal Aunt   . Thyroid disease Maternal Grandmother   . Cancer Maternal Grandmother     bladder   . Stroke Paternal Grandmother   . Parkinsonism Paternal Grandmother   . Stroke Paternal Grandfather   . Anesthesia problems Neg Hx   . Hypotension Neg Hx   . Malignant hyperthermia Neg Hx   . Pseudochol deficiency Neg Hx   . Hypertension Maternal Uncle    History  Substance Use Topics  . Smoking status: Never Smoker   . Smokeless tobacco: Never Used  . Alcohol Use: No   OB History    Gravida Para Term Preterm AB TAB SAB Ectopic Multiple Living   5 3 3  2  2   3      Review of Systems  Constitutional: Negative  for fever and chills.  HENT: Negative for trouble swallowing.   Respiratory: Negative for shortness of breath.   Gastrointestinal: Negative for nausea and vomiting.  Skin: Positive for rash.  All other systems reviewed and are negative.   Allergies  Hctz; Codeine; Iron; Penicillins; and Sulfa antibiotics  Home Medications   Prior to Admission medications   Medication Sig Start Date End Date Taking? Authorizing Provider  Calcium Carbonate-Vitamin D (CALCIUM 600/VITAMIN D) 600-400 MG-UNIT per chew tablet Chew 1 tablet by mouth daily.   Yes Historical Provider, MD  Multiple Vitamins-Calcium (ONE-A-DAY WOMENS FORMULA PO) Take 1 tablet by mouth daily.   Yes Historical Provider, MD  Omega-3 Fatty Acids (FISH OIL PO) Take 1 capsule by mouth daily.    Yes Historical Provider, MD  Probiotic Product (PROBIOTIC FORMULA PO) Take 1 capsule by mouth daily.   Yes Historical Provider, MD  sertraline (ZOLOFT) 50 MG tablet TAKE 1 TABLET BY MOUTH EVERY DAY 01/21/14  Yes Jeoffrey MassedPhilip H McGowen, MD  famotidine (PEPCID) 20 MG tablet Take 1 tablet (20 mg total) by mouth 2 (two) times daily. 03/08/14   Hope Orlene OchM Neese, NP  predniSONE (DELTASONE) 10 MG tablet Take 2 tablets (20 mg total) by mouth 2 (two) times daily with a meal. 03/08/14   Hope Orlene OchM Neese, NP   Triage Vitals: BP 135/94 mmHg  Pulse 78  Temp(Src) 98.9 F (37.2 C) (Oral)  Resp 15  Ht 5\' 6"  (1.676 m)  Wt 187 lb (84.823 kg)  BMI 30.20 kg/m2  SpO2 100%  LMP 03/02/2014 Physical Exam  Constitutional: She is oriented to person, place, and time. She appears well-developed and well-nourished.  HENT:  Mouth/Throat: Uvula is midline, oropharynx is clear and moist and mucous membranes are normal. No oropharyngeal exudate, posterior oropharyngeal edema, posterior oropharyngeal erythema or tonsillar abscesses.  Eyes: EOM are normal.  Neck: Neck supple.  Cardiovascular: Normal rate, regular rhythm and normal heart sounds.  Exam reveals no gallop and no friction rub.   No murmur heard. Pulmonary/Chest: Effort normal and breath sounds normal. No respiratory distress. She has no wheezes. She has no rales.  Abdominal: Soft. There is no tenderness.  Musculoskeletal: Normal range of motion.  Neurological: She is alert and oriented to person, place, and time. No cranial nerve deficit.  Skin: Skin is warm and dry. Rash noted.  Few small red raised areas on posterior neck.  Nursing note and vitals reviewed.   ED Course  Procedures (including critical care time) DIAGNOSTIC STUDIES: Oxygen Saturation is 100% on RA, normal by my interpretation.   COORDINATION OF CARE: 2:03 PM- Will prescribe Benadryl, Pepcid and Prednisone. Pt verbalizes understanding and agrees to plan.   MDM  38 y.o. female with allergic  reaction to unknown substance. Symptoms have resolved with taking Benadryl prior to arrival to the ED and wiping her face with a cool wet wash cloth. She repots some itching to the back of her neck but no other symptoms. Stable for discharge without respiratory symptoms or swelling of her throat or difficulty swallowing.  Discussed with the patient clinical findings and plan of care and all questioned fully answered. She will return if any problems arise.    Medication List    TAKE these medications        famotidine 20 MG tablet  Commonly known as:  PEPCID  Take 1 tablet (20 mg total) by mouth 2 (two) times daily.     predniSONE 10 MG tablet  Commonly known as:  DELTASONE  Take 2 tablets (20 mg total) by mouth 2 (two) times daily with a meal.      ASK your doctor about these medications        CALCIUM 600/VITAMIN D 600-400 MG-UNIT per chew tablet  Generic drug:  Calcium Carbonate-Vitamin D  Chew 1 tablet by mouth daily.     FISH OIL PO  Take 1 capsule by mouth daily.     ONE-A-DAY WOMENS FORMULA PO  Take 1 tablet by mouth daily.     PROBIOTIC FORMULA PO  Take 1 capsule by mouth daily.     sertraline 50 MG tablet  Commonly known as:  ZOLOFT  TAKE 1 TABLET BY MOUTH EVERY DAY        Final diagnoses:  Contact dermatitis, allergic   I personally performed the services described in this documentation, which was scribed in my presence. The recorded information has been reviewed and is accurate.    [address]Hope [place] Beach[place] Neese, NP 03/08/14 1707  Vida RollerBrian D Miller, MD 03/08/14 [phone]

## 2014-03-16 ENCOUNTER — Ambulatory Visit: Payer: 59 | Admitting: Nurse Practitioner

## 2014-03-17 ENCOUNTER — Encounter: Payer: Self-pay | Admitting: Family Medicine

## 2014-03-17 ENCOUNTER — Ambulatory Visit (INDEPENDENT_AMBULATORY_CARE_PROVIDER_SITE_OTHER): Payer: 59 | Admitting: Family Medicine

## 2014-03-17 VITALS — BP 136/91 | HR 99 | Temp 98.9°F | Resp 18 | Ht 66.5 in | Wt 193.0 lb

## 2014-03-17 DIAGNOSIS — J069 Acute upper respiratory infection, unspecified: Secondary | ICD-10-CM

## 2014-03-17 DIAGNOSIS — J029 Acute pharyngitis, unspecified: Secondary | ICD-10-CM

## 2014-03-17 NOTE — Patient Instructions (Signed)
Buy OTC Mucinex DM and follow instructions on package. Continue saline nasal spray to irrigate nasal passages. Take 1000 mg tylenol every 6 hours as needed for pain. FLUIDS!   REST!

## 2014-03-17 NOTE — Progress Notes (Signed)
OFFICE NOTE  03/17/2014  CC:  Chief Complaint  Patient presents with  . Sore Throat  . Generalized Body Aches  . Eye Pain    HPI: Patient is a 38 y.o. Caucasian female who is here for resp illness. Onset 4-5d ago, nasal congestion, cough, ST, eyes irritated.  Lots of PND. HA, body aches.  No fever.  Tried OTC meds except mucinex DM and robitussin DM.    Pertinent PMH:  Past medical, surgical, social, and family history reviewed and no changes are noted since last office visit.  MEDS: Not on prednisone listed below Outpatient Prescriptions Prior to Visit  Medication Sig Dispense Refill  . Calcium Carbonate-Vitamin D (CALCIUM 600/VITAMIN D) 600-400 MG-UNIT per chew tablet Chew 1 tablet by mouth daily.    . Multiple Vitamins-Calcium (ONE-A-DAY WOMENS FORMULA PO) Take 1 tablet by mouth daily.    . Omega-3 Fatty Acids (FISH OIL PO) Take 1 capsule by mouth daily.    . Probiotic Product (PROBIOTIC FORMULA PO) Take 1 capsule by mouth daily.    . sertraline (ZOLOFT) 50 MG tablet TAKE 1 TABLET BY MOUTH EVERY DAY 90 tablet 3  . famotidine (PEPCID) 20 MG tablet Take 1 tablet (20 mg total) by mouth 2 (two) times daily. 30 tablet 0  . predniSONE (DELTASONE) 10 MG tablet Take 2 tablets (20 mg total) by mouth 2 (two) times daily with a meal. 16 tablet 0   No facility-administered medications prior to visit.    PE: Blood pressure 136/91, pulse 99, temperature 98.9 F (37.2 C), temperature source Temporal, resp. rate 18, height 5' 6.5" (1.689 m), weight 193 lb (87.544 kg), last menstrual period 03/02/2014, SpO2 99 %. VS: noted--normal. Gen: alert, NAD, NONTOXIC APPEARING. HEENT: eyes without injection, drainage, or swelling.  Ears: EACs clear, TMs with normal light reflex and landmarks.  Nose: Clear rhinorrhea, with some dried, crusty exudate adherent to mildly injected mucosa.  No purulent d/c.  No paranasal sinus TTP.  No facial swelling.  Throat and mouth without focal lesion.  No pharyngial  swelling, erythema, or exudate.   Neck: supple, no LAD.   LUNGS: CTA bilat, nonlabored resps.   CV: RRR, no m/r/g. EXT: no c/c/e SKIN: no rash   Rapid strep: neg  IMPRESSION AND PLAN:  Viral URI. Trial of mucinex DM or robitussin DM otc as directed on the box. May use OTC nasal saline spray or irrigation solution bid. OTC nonsedating antihistamines prn discussed.  Decongestant use discussed--ok if tolerated in the past w/out side effect and if pt has no hx of HTN. Group A strep throat clx sent. FOLLOW UP: prn

## 2014-03-17 NOTE — Progress Notes (Signed)
Pre visit review using our clinic review tool, if applicable. No additional management support is needed unless otherwise documented below in the visit note. 

## 2014-03-19 LAB — CULTURE, GROUP A STREP: ORGANISM ID, BACTERIA: NORMAL

## 2014-10-17 LAB — OB RESULTS CONSOLE RPR: RPR: NONREACTIVE

## 2014-10-17 LAB — OB RESULTS CONSOLE GC/CHLAMYDIA
CHLAMYDIA, DNA PROBE: NEGATIVE
Gonorrhea: NEGATIVE

## 2014-10-17 LAB — OB RESULTS CONSOLE HIV ANTIBODY (ROUTINE TESTING): HIV: NONREACTIVE

## 2014-10-23 ENCOUNTER — Inpatient Hospital Stay (HOSPITAL_COMMUNITY): Admission: AD | Admit: 2014-10-23 | Payer: Self-pay | Source: Ambulatory Visit | Admitting: Obstetrics & Gynecology

## 2015-03-23 LAB — OB RESULTS CONSOLE GBS: GBS: POSITIVE

## 2015-03-28 NOTE — L&D Delivery Note (Addendum)
Delivery Note At 1:37 PM a viable female, "Mary Gross", was delivered via Vaginal, Spontaneous Delivery (Presentation: Right Occiput Anterior).  APGAR: 9, 9; weight  .   Placenta status: Intact, Spontaneous.  Cord: 3 vessels with the following complications: None.  Cord pH: NA  Anesthesia: Epidural  Episiotomy: None Lacerations: 1st degree;Perineal Suture Repair: 3.0 vicryl Est. Blood Loss (mL):  275 cc  Mom to postpartum.  Baby to Couplet care / Skin to Skin. Patient plans inpatient circumcision and vasectomy for contraception. Will observe BP postpartum.  Yarima Penman 03/30/2015, 2:06 PM

## 2015-03-30 ENCOUNTER — Inpatient Hospital Stay (HOSPITAL_COMMUNITY): Payer: BLUE CROSS/BLUE SHIELD | Admitting: Anesthesiology

## 2015-03-30 ENCOUNTER — Encounter (HOSPITAL_COMMUNITY): Payer: Self-pay

## 2015-03-30 ENCOUNTER — Inpatient Hospital Stay (HOSPITAL_COMMUNITY)
Admission: RE | Admit: 2015-03-30 | Discharge: 2015-03-31 | DRG: 775 | Disposition: A | Discharge status: Home / Self Care | Payer: BLUE CROSS/BLUE SHIELD | Source: Ambulatory Visit | Attending: Obstetrics and Gynecology | Admitting: Obstetrics and Gynecology

## 2015-03-30 DIAGNOSIS — Z880 Allergy status to penicillin: Secondary | ICD-10-CM | POA: Diagnosis not present

## 2015-03-30 DIAGNOSIS — O09529 Supervision of elderly multigravida, unspecified trimester: Secondary | ICD-10-CM

## 2015-03-30 DIAGNOSIS — Z8249 Family history of ischemic heart disease and other diseases of the circulatory system: Secondary | ICD-10-CM

## 2015-03-30 DIAGNOSIS — Z823 Family history of stroke: Secondary | ICD-10-CM

## 2015-03-30 DIAGNOSIS — Z818 Family history of other mental and behavioral disorders: Secondary | ICD-10-CM

## 2015-03-30 DIAGNOSIS — B951 Streptococcus, group B, as the cause of diseases classified elsewhere: Secondary | ICD-10-CM | POA: Diagnosis present

## 2015-03-30 DIAGNOSIS — IMO0002 Reserved for concepts with insufficient information to code with codable children: Secondary | ICD-10-CM | POA: Diagnosis present

## 2015-03-30 DIAGNOSIS — Z809 Family history of malignant neoplasm, unspecified: Secondary | ICD-10-CM | POA: Diagnosis not present

## 2015-03-30 DIAGNOSIS — O9902 Anemia complicating childbirth: Secondary | ICD-10-CM | POA: Diagnosis present

## 2015-03-30 DIAGNOSIS — Z3A39 39 weeks gestation of pregnancy: Secondary | ICD-10-CM

## 2015-03-30 DIAGNOSIS — D649 Anemia, unspecified: Secondary | ICD-10-CM | POA: Diagnosis present

## 2015-03-30 DIAGNOSIS — Z889 Allergy status to unspecified drugs, medicaments and biological substances status: Secondary | ICD-10-CM

## 2015-03-30 DIAGNOSIS — Z882 Allergy status to sulfonamides status: Secondary | ICD-10-CM | POA: Diagnosis not present

## 2015-03-30 DIAGNOSIS — O0930 Supervision of pregnancy with insufficient antenatal care, unspecified trimester: Secondary | ICD-10-CM

## 2015-03-30 DIAGNOSIS — O134 Gestational [pregnancy-induced] hypertension without significant proteinuria, complicating childbirth: Secondary | ICD-10-CM | POA: Diagnosis present

## 2015-03-30 DIAGNOSIS — O3663X0 Maternal care for excessive fetal growth, third trimester, not applicable or unspecified: Secondary | ICD-10-CM | POA: Diagnosis present

## 2015-03-30 DIAGNOSIS — Z820 Family history of epilepsy and other diseases of the nervous system: Secondary | ICD-10-CM | POA: Diagnosis not present

## 2015-03-30 DIAGNOSIS — O99824 Streptococcus B carrier state complicating childbirth: Secondary | ICD-10-CM | POA: Diagnosis present

## 2015-03-30 DIAGNOSIS — F419 Anxiety disorder, unspecified: Secondary | ICD-10-CM | POA: Diagnosis present

## 2015-03-30 DIAGNOSIS — O139 Gestational [pregnancy-induced] hypertension without significant proteinuria, unspecified trimester: Secondary | ICD-10-CM | POA: Diagnosis present

## 2015-03-30 LAB — LACTATE DEHYDROGENASE: LDH: 144 U/L (ref 98–192)

## 2015-03-30 LAB — COMPREHENSIVE METABOLIC PANEL
ALT: 9 U/L — ABNORMAL LOW (ref 14–54)
ANION GAP: 11 (ref 5–15)
AST: 18 U/L (ref 15–41)
Albumin: 2.8 g/dL — ABNORMAL LOW (ref 3.5–5.0)
Alkaline Phosphatase: 114 U/L (ref 38–126)
BUN: 5 mg/dL — ABNORMAL LOW (ref 6–20)
CHLORIDE: 105 mmol/L (ref 101–111)
CO2: 21 mmol/L — ABNORMAL LOW (ref 22–32)
CREATININE: 0.65 mg/dL (ref 0.44–1.00)
Calcium: 9.3 mg/dL (ref 8.9–10.3)
Glucose, Bld: 100 mg/dL — ABNORMAL HIGH (ref 65–99)
POTASSIUM: 3.3 mmol/L — AB (ref 3.5–5.1)
SODIUM: 137 mmol/L (ref 135–145)
Total Bilirubin: 0.5 mg/dL (ref 0.3–1.2)
Total Protein: 6.1 g/dL — ABNORMAL LOW (ref 6.5–8.1)

## 2015-03-30 LAB — PROTEIN / CREATININE RATIO, URINE
CREATININE, URINE: 87 mg/dL
Protein Creatinine Ratio: 0.16 mg/mg{Cre} — ABNORMAL HIGH (ref 0.00–0.15)
Total Protein, Urine: 14 mg/dL

## 2015-03-30 LAB — CBC
HCT: 32.3 % — ABNORMAL LOW (ref 36.0–46.0)
HEMATOCRIT: 29.7 % — AB (ref 36.0–46.0)
HEMOGLOBIN: 10.9 g/dL — AB (ref 12.0–15.0)
Hemoglobin: 9.8 g/dL — ABNORMAL LOW (ref 12.0–15.0)
MCH: 29.9 pg (ref 26.0–34.0)
MCH: 29.6 pg (ref 26.0–34.0)
MCHC: 33.7 g/dL (ref 30.0–36.0)
MCHC: 33 g/dL (ref 30.0–36.0)
MCV: 88.7 fL (ref 78.0–100.0)
MCV: 89.7 fL (ref 78.0–100.0)
PLATELETS: 281 10*3/uL (ref 150–400)
Platelets: 268 10*3/uL (ref 150–400)
RBC: 3.64 MIL/uL — AB (ref 3.87–5.11)
RBC: 3.31 MIL/uL — AB (ref 3.87–5.11)
RDW: 13.8 % (ref 11.5–15.5)
RDW: 13.9 % (ref 11.5–15.5)
WBC: 10.4 10*3/uL (ref 4.0–10.5)
WBC: 10.7 10*3/uL — AB (ref 4.0–10.5)

## 2015-03-30 LAB — TYPE AND SCREEN
ABO/RH(D): B POS
ANTIBODY SCREEN: NEGATIVE

## 2015-03-30 LAB — RPR: RPR: NONREACTIVE

## 2015-03-30 LAB — ABO/RH: ABO/RH(D): B POS

## 2015-03-30 LAB — URIC ACID: URIC ACID, SERUM: 6.7 mg/dL — AB (ref 2.3–6.6)

## 2015-03-30 MED ORDER — TERBUTALINE SULFATE 1 MG/ML IJ SOLN
0.2500 mg | Freq: Once | INTRAMUSCULAR | Status: DC | PRN
Start: 2015-03-30 — End: 2015-03-30
  Filled 2015-03-30: qty 1

## 2015-03-30 MED ORDER — HYDRALAZINE HCL 20 MG/ML IJ SOLN: 10.0000 mg | Freq: Once | INTRAMUSCULAR | Status: DC | PRN

## 2015-03-30 MED ORDER — FENTANYL 2.5 MCG/ML BUPIVACAINE 1/10 % EPIDURAL INFUSION (WH - ANES)
14.0000 mL/h | INTRAMUSCULAR | Status: DC | PRN
  Administered 2015-03-30: 14 mL/h via EPIDURAL
  Filled 2015-03-30: qty 125

## 2015-03-30 MED ORDER — SENNOSIDES-DOCUSATE SODIUM 8.6-50 MG PO TABS
2.0000 | ORAL_TABLET | ORAL | Status: DC
  Administered 2015-03-30: 2 via ORAL
  Filled 2015-03-30: qty 2

## 2015-03-30 MED ORDER — TRAMADOL HCL 50 MG PO TABS: 50.0000 mg | ORAL_TABLET | Freq: Four times a day (QID) | ORAL | Status: DC | PRN

## 2015-03-30 MED ORDER — LANOLIN HYDROUS EX OINT: TOPICAL_OINTMENT | CUTANEOUS | Status: DC | PRN

## 2015-03-30 MED ORDER — SIMETHICONE 80 MG PO CHEW: 80.0000 mg | CHEWABLE_TABLET | ORAL | Status: DC | PRN

## 2015-03-30 MED ORDER — ZOLPIDEM TARTRATE 5 MG PO TABS: 5.0000 mg | ORAL_TABLET | Freq: Every evening | ORAL | Status: DC | PRN

## 2015-03-30 MED ORDER — OXYTOCIN BOLUS FROM INFUSION
500.0000 mL | INTRAVENOUS | Status: DC
  Administered 2015-03-30: 500 mL via INTRAVENOUS

## 2015-03-30 MED ORDER — LIDOCAINE HCL (PF) 1 % IJ SOLN
30.0000 mL | INTRAMUSCULAR | Status: AC | PRN
  Administered 2015-03-30: 3 mL via SUBCUTANEOUS
  Administered 2015-03-30: 2 mL via SUBCUTANEOUS
  Administered 2015-03-30: 5 mL via SUBCUTANEOUS

## 2015-03-30 MED ORDER — VANCOMYCIN HCL IN DEXTROSE 1-5 GM/200ML-% IV SOLN
1000.0000 mg | Freq: Two times a day (BID) | INTRAVENOUS | Status: DC
  Administered 2015-03-30: 1000 mg via INTRAVENOUS
  Filled 2015-03-30 (×2): qty 200

## 2015-03-30 MED ORDER — LABETALOL HCL 5 MG/ML IV SOLN
20.0000 mg | INTRAVENOUS | Status: DC | PRN
  Administered 2015-03-30: 20 mg via INTRAVENOUS
  Filled 2015-03-30: qty 4

## 2015-03-30 MED ORDER — OXYTOCIN 40 UNITS IN LACTATED RINGERS INFUSION - SIMPLE MED: 62.5000 mL/h | INTRAVENOUS | Status: DC

## 2015-03-30 MED ORDER — OXYTOCIN 40 UNITS IN LACTATED RINGERS INFUSION - SIMPLE MED
1.0000 m[IU]/min | INTRAVENOUS | Status: DC
  Filled 2015-03-30: qty 1000

## 2015-03-30 MED ORDER — DIPHENHYDRAMINE HCL 50 MG/ML IJ SOLN: 12.5000 mg | INTRAMUSCULAR | Status: DC | PRN

## 2015-03-30 MED ORDER — TETANUS-DIPHTH-ACELL PERTUSSIS 5-2.5-18.5 LF-MCG/0.5 IM SUSP: 0.5000 mL | Freq: Once | INTRAMUSCULAR | Status: DC

## 2015-03-30 MED ORDER — FENTANYL CITRATE (PF) 100 MCG/2ML IJ SOLN: 100.0000 ug | INTRAMUSCULAR | Status: DC | PRN

## 2015-03-30 MED ORDER — PHENYLEPHRINE 40 MCG/ML (10ML) SYRINGE FOR IV PUSH (FOR BLOOD PRESSURE SUPPORT)
80.0000 ug | PREFILLED_SYRINGE | INTRAVENOUS | Status: DC | PRN
  Filled 2015-03-30: qty 2
  Filled 2015-03-30: qty 20

## 2015-03-30 MED ORDER — BENZOCAINE-MENTHOL 20-0.5 % EX AERO
1.0000 "application " | INHALATION_SPRAY | CUTANEOUS | Status: DC | PRN
  Administered 2015-03-31: 1 via TOPICAL
  Filled 2015-03-30: qty 56

## 2015-03-30 MED ORDER — ACETAMINOPHEN 325 MG PO TABS: 650.0000 mg | ORAL_TABLET | ORAL | Status: DC | PRN

## 2015-03-30 MED ORDER — OXYTOCIN 40 UNITS IN LACTATED RINGERS INFUSION - SIMPLE MED
1.0000 m[IU]/min | INTRAVENOUS | Status: DC
  Administered 2015-03-30: 1 m[IU]/min via INTRAVENOUS
  Filled 2015-03-30: qty 1000

## 2015-03-30 MED ORDER — DIBUCAINE 1 % RE OINT: 1.0000 "application " | TOPICAL_OINTMENT | RECTAL | Status: DC | PRN

## 2015-03-30 MED ORDER — LIDOCAINE HCL (PF) 1 % IJ SOLN
INTRAMUSCULAR | Status: AC
  Filled 2015-03-30: qty 30

## 2015-03-30 MED ORDER — CITRIC ACID-SODIUM CITRATE 334-500 MG/5ML PO SOLN: 30.0000 mL | ORAL | Status: DC | PRN

## 2015-03-30 MED ORDER — FLEET ENEMA 7-19 GM/118ML RE ENEM: 1.0000 | ENEMA | RECTAL | Status: DC | PRN

## 2015-03-30 MED ORDER — WITCH HAZEL-GLYCERIN EX PADS: 1.0000 "application " | MEDICATED_PAD | CUTANEOUS | Status: DC | PRN

## 2015-03-30 MED ORDER — ONDANSETRON HCL 4 MG/2ML IJ SOLN: 4.0000 mg | Freq: Four times a day (QID) | INTRAMUSCULAR | Status: DC | PRN

## 2015-03-30 MED ORDER — ONDANSETRON HCL 4 MG PO TABS: 4.0000 mg | ORAL_TABLET | ORAL | Status: DC | PRN

## 2015-03-30 MED ORDER — IBUPROFEN 600 MG PO TABS
600.0000 mg | ORAL_TABLET | Freq: Four times a day (QID) | ORAL | Status: DC
  Administered 2015-03-30 – 2015-03-31 (×4): 600 mg via ORAL
  Filled 2015-03-30 (×4): qty 1

## 2015-03-30 MED ORDER — EPHEDRINE 5 MG/ML INJ
10.0000 mg | INTRAVENOUS | Status: DC | PRN
  Filled 2015-03-30: qty 2

## 2015-03-30 MED ORDER — LACTATED RINGERS IV SOLN
500.0000 mL | INTRAVENOUS | Status: DC | PRN
  Administered 2015-03-30: 500 mL via INTRAVENOUS

## 2015-03-30 MED ORDER — ONDANSETRON HCL 4 MG/2ML IJ SOLN: 4.0000 mg | INTRAMUSCULAR | Status: DC | PRN

## 2015-03-30 MED ORDER — DIPHENHYDRAMINE HCL 25 MG PO CAPS: 25.0000 mg | ORAL_CAPSULE | Freq: Four times a day (QID) | ORAL | Status: DC | PRN

## 2015-03-30 MED ORDER — LACTATED RINGERS IV SOLN
INTRAVENOUS | Status: DC
Start: 2015-03-30 — End: 2015-03-30
  Administered 2015-03-30 (×2): via INTRAVENOUS

## 2015-03-30 NOTE — Anesthesia Preprocedure Evaluation (Signed)
Anesthesia Evaluation  Patient identified by MRN, date of birth, ID band Patient awake    Reviewed: Allergy & Precautions, H&P , Patient's Chart, lab work & pertinent test results  Airway Mallampati: II  TM Distance: >3 FB Neck ROM: full    Dental  (+) Teeth Intact   Pulmonary    breath sounds clear to auscultation       Cardiovascular hypertension (Received Labatolol ths am; No chronic Rx.), On Medications  Rhythm:regular Rate:Normal     Neuro/Psych    GI/Hepatic   Endo/Other    Renal/GU      Musculoskeletal   Abdominal   Peds  Hematology   Anesthesia Other Findings  PIH     Reproductive/Obstetrics (+) Pregnancy                             Anesthesia Physical Anesthesia Plan  ASA: II  Anesthesia Plan: Epidural   Post-op Pain Management:    Induction:   Airway Management Planned:   Additional Equipment:   Intra-op Plan:   Post-operative Plan:   Informed Consent: I have reviewed the patients History and Physical, chart, labs and discussed the procedure including the risks, benefits and alternatives for the proposed anesthesia with the patient or authorized representative who has indicated his/her understanding and acceptance.   Dental Advisory Given  Plan Discussed with:   Anesthesia Plan Comments: (Labs checked- platelets confirmed with RN in room. Fetal heart tracing, per RN, reported to be stable enough for sitting procedure. Discussed epidural, and patient consents to the procedure:  included risk of possible headache,backache, failed block, allergic reaction, and nerve injury. This patient was asked if she had any questions or concerns before the procedure started.)        Anesthesia Quick Evaluation

## 2015-03-30 NOTE — Progress Notes (Signed)
  Subjective: Comfortable with epidural.  Objective: BP 148/91 mmHg  Pulse 88  Temp(Src) 98.4 F (36.9 C) (Oral)  Resp 18  Ht 5\' 6"  (1.676 m)  Wt 94.348 kg (208 lb)  BMI 33.59 kg/m2  SpO2 99%  LMP 06/27/2014 (LMP Unknown)      Filed Vitals:   03/30/15 0931 03/30/15 1001 03/30/15 1031 03/30/15 1101  BP: 139/83 153/90 156/92 148/91  Pulse: 82 82 83 88  Temp:    98.4 F (36.9 C)  TempSrc:    Oral  Resp:  18  18  Height:      Weight:      SpO2:       Received Vancomycin dose.  FHT: Category 1 UC:   regular, every 2-3 minutes SVE: 7 cm, 75%, vtx, -2/-3.   Pitocin at 6 mu/min  AROM deferred at present due to high position of vtx.  Assessment:  Active labor GBS positive Gestational hypertension  Plan: Continue pitocin induction. AROM when vtx better applied.  Nigel BridgemanLATHAM, Blaze Nylund CNM 03/30/2015, 11:22 AM

## 2015-03-30 NOTE — Anesthesia Procedure Notes (Signed)

## 2015-03-30 NOTE — H&P (Signed)
Mary Gross is a 40 y.o. female, [phone] at  weeks, presenting for induction due to gestational hypertension, advanced cervical dilation, GBS positive, hx LGA fetus.  Reports +FM, occasional contractions, small amount bloody show.  Cervix was 4 cm in the office on 03/26/15.  BP was 142/86 at that visit, with hx of gestational hypertension in prior pregnancies, likely chronic hypertension, no previous meds.  Patient Active Problem List   Diagnosis Date Noted  . LGA (large for gestational age) fetus 03/30/2015  . AMA (advanced maternal age) multigravida 35+--declined testing 03/30/2015  . Positive GBS test 03/30/2015  . Allergy to penicillin 03/30/2015  . Allergy to sulfa drugs 03/30/2015  . Gestational hypertension 03/30/2015  . Anxiety disorder 03/30/2015  . Allergy history, drug--HCTZ 03/30/2015  . Late prenatal care--at 15 weeks. 03/30/2015    History of present pregnancy: Patient entered care at 15 weeks.   EDC of 04/02/14 was established by LMP, with congruence with 20 6/7 weeks [place].   Anatomy scan:  20 6/7 weeks, with limited anatomy and an anterior placenta.   Additional [place] evaluations:   24 3/7 weeks:  EFW 1+11, 63%ile, breech, normal fluid, completion of anatomy. Significant prenatal events:  Early glucola WNL, later 3 hour GTT WNL.  Concerned about EFW, due to hx of 10 lb baby and rapid labor, desired consideration for induction at term.  Declined genetic testing.  URI at 38 6/7 weeks, treated with Zpak.  BP during ROB visits: 110-142/62-86. Last evaluation:  03/26/15--BP 142/86, weight 208, cervix 4 cm, 70%, vtx, -2.  Scheduled for induction on 1/3 due to favorable cervix and hx of gestational hypertension.    OB History    Gravida Para Term Preterm AB TAB SAB Ectopic Multiple Living   6 3 3  2  2   3     2001--SVB, 41 weeks, 8+9, delivered with [facility], gestational hypertension 2009--SVB, 41 weeks, 10+3, delivered with [facility], gestational hypertension 2012--SAB, 10  weeks. 2013--SVB, 41 weeks, 8+2, delivered with [facility], gestational hypertension  Past Medical History  Diagnosis Date  . Depression     after pregnancy loss was on zoloft  . Complication of anesthesia     leg weakness for 1 year after epidural in 2001  . AMA (advanced maternal age) multigravida 35+   . Hypertension     during pregnancy, + white coat (trial of enalapril  + ACE/HCTZ caused bp too low/sluggish.  Repeated home bp measurements normal.  . Anxiety     zoloft 25mg  helps a lot  . Pregnancy-induced hypertension, history with 1st preg 05/09/2011  . Lumbar spondylosis 07/27/11    with grade I spondylolisthesis (x-ray at chiropracter's office)   Past Surgical History  Procedure Laterality Date  . Cystectomy  2007    vulvar cyst  . Eye surgery      lasik  . Bartholin gland cyst excision      7-8 yrs ago  . Cholecystectomy  07/07/2011    Procedure: LAPAROSCOPIC CHOLECYSTECTOMY;  Surgeon: Dalia Heading, MD;  Location: [facility];  Service: General;  Laterality: N/A;   Family History: family history includes Cancer in her maternal grandmother; Depression in her mother; Hypertension in her father, maternal uncle, and paternal aunt; Kidney disease in her father; Parkinsonism in her paternal grandmother; Peripheral vascular disease in her mother; Stroke in her paternal grandfather and paternal grandmother; Thyroid disease in her maternal grandmother. There is no history of Anesthesia problems, Hypotension, Malignant hyperthermia, or Pseudochol deficiency.   Social History:  reports that she has never smoked. She has never used smokeless tobacco. She reports that she does not drink alcohol or use illicit drugs.  Patient is Caucasian, of the [place] faith, has some college, is a Games developerAHM, married to HollisterPhillip, who is involved and supportive.  Patient's mother is present with her today.   Prenatal Transfer Tool  Maternal Diabetes: No Genetic Screening: Declined Maternal Ultrasounds/Referrals:  Normal Fetal Ultrasounds or other Referrals:  None Maternal Substance Abuse:  No Significant Maternal Medications:  None Significant Maternal Lab Results: Lab values include: Group B Strep positive  TDAP 03/08/15 Flu 12/15/14  ROS:  Occasional UCs, +FM  Allergies  Allergen Reactions  . Hctz [Hydrochlorothiazide] Other (See Comments)    Malaise/HA/orthostatic sx's  . Codeine Rash  . Iron Rash  . Penicillins Rash    Has patient had a PCN reaction causing immediate rash, facial/tongue/throat swelling, SOB or lightheadedness with hypotension: No Has patient had a PCN reaction causing severe rash involving mucus membranes or skin necrosis: No Has patient had a PCN reaction that required hospitalization Yes Has patient had a PCN reaction occurring within the last 10 years: No If all of the above answers are "NO", then may proceed with Cephalosporin use.     . Sulfa Antibiotics Rash   Initial BPs:  173/102, 157/94, 159/100, 160/96, 166/97, 166/93  Filed Vitals:   03/30/15 0925 03/30/15 0926 03/30/15 0930 03/30/15 0931  BP:  137/88  139/83  Pulse: 85 82 83 82  Temp:      TempSrc:      Resp:      Height:      Weight:      SpO2: 99%  99%    Labetalol 20 mg at 0809  Dilation: 5.5 Effacement (%): 70 Station: -2 Exam by:: v.lathem,cnm Blood pressure 137/88, pulse 82, temperature 98.5 F (36.9 C), temperature source Oral, resp. rate 20, height 5\' 6"  (1.676 m), weight 94.348 kg (208 lb), last menstrual period 06/27/2014, SpO2 99 %.  Chest clear Heart RRR without murmur Abd gravid, NT, FH 40 cm Pelvic: 5-6, 70%, vtx, -2, small amount bloody show Ext: DTR 2+, no clonus, 1+ edema  FHR: Category 1 UCs:  q 5-7 min, mild  Prenatal labs: ABO, Rh:  B+ Antibody:  Neg Rubella:  Immune RPR:   NR HBsAg:   Neg HIV:   NR GBS:  Positive 03/08/15 Sickle cell/Hgb electrophoresis:  NA Pap:  WNL 2014 GC:  Negative 10/12/14 Chlamydia: Negative 10/12/14 Genetic screenings:   Declines Glucola:  Elevated, normal 3 hour GTT Other:   Hgb 12.7 at NOB, 11.6 at 28 weeks PIH labs WNL 03/26/15 PCR 0.18      Assessment/Plan: IUP at [redacted] weeks Gestational hypertension Hx LGA fetuses Favorable cervix GBS positive--resistant to Clindamycin, sensitive to Vancomycin Allergies to PCN, Sulfa, codeine, Fe, HCTZ  Plan: Admit to Birthing Suite per consult with Dr. Normand Sloopillard Routine [facility] orders Pain med/epidural prn Vancomycin for GBS prophylaxis PIH labs and PCR Treat BP for parameters AROM as appropriate after ATB administration  Aliese Brannum, CNM, [place] 03/30/2015, 9:36 AM

## 2015-03-31 LAB — CBC
HCT: 27.2 % — ABNORMAL LOW (ref 36.0–46.0)
Hemoglobin: 8.9 g/dL — ABNORMAL LOW (ref 12.0–15.0)
MCH: 29.2 pg (ref 26.0–34.0)
MCHC: 32.7 g/dL (ref 30.0–36.0)
MCV: 89.2 fL (ref 78.0–100.0)
PLATELETS: 241 10*3/uL (ref 150–400)
RBC: 3.05 MIL/uL — AB (ref 3.87–5.11)
RDW: 14.1 % (ref 11.5–15.5)
WBC: 14.2 10*3/uL — ABNORMAL HIGH (ref 4.0–10.5)

## 2015-03-31 MED ORDER — LABETALOL HCL 200 MG PO TABS: 200.0000 mg | ORAL_TABLET | Freq: Two times a day (BID) | ORAL | Status: DC

## 2015-03-31 MED ORDER — LABETALOL HCL 200 MG PO TABS
200.0000 mg | ORAL_TABLET | Freq: Two times a day (BID) | ORAL | Status: DC
  Administered 2015-03-31: 200 mg via ORAL
  Filled 2015-03-31: qty 1

## 2015-03-31 MED ORDER — IBUPROFEN 600 MG PO TABS: 600.0000 mg | ORAL_TABLET | Freq: Four times a day (QID) | ORAL | Status: DC

## 2015-03-31 NOTE — Progress Notes (Signed)
Erin SonsVickie Latham called to notify her that the smart start nurse could not check the patient's Blood pressure this week. Patient states she wants her MD office to check it. Manfred ArchV. Latham states it is ok for her to get her Blood Pressure checked in her MD office as long as she [place] [facility] with Blood pressure results. Patient will be told to do that by RN.

## 2015-03-31 NOTE — Clinical Social Work Maternal (Signed)
  CLINICAL SOCIAL WORK MATERNAL/CHILD NOTE  Patient Details  Name: Mary Gross MRN: [id number] Date of Birth: 06-09-1975  Date:  03/31/2015  Clinical Social Worker Initiating Note:  Loleta BooksSarah Valerye Kobus MSW, LCSW Date/ Time Initiated:  03/31/15/1000     Child's Name:  Mary Gross   Legal Guardian:  Mary Ochonya Hodkinson and Mary Gross   Need for Interpreter:  None   Date of Referral:  03/30/15     Reason for Referral:  History of anxiety and depression  [facility]   Address:  [address] [place], [place] [address]  Phone number:  [phone]   Household Members:  Minor Children, Spouse   Natural Supports (not living in the home):  Immediate Family   Professional Supports: None   Employment: Homemaker   Type of Work:   N/A  Education:    N/A  Architectinancial Resources:  Media plannerrivate Insurance   Other Resources:    None identified   Cultural/Religious Considerations Which May Impact Care:  None reported   Strengths:  Ability to meet basic needs , Merchandiser, retailediatrician chosen , Home prepared for child    Risk Factors/Current Problems:  None   Cognitive State:  Able to Concentrate , Alert , Goal Oriented , Linear Thinking    Mood/Affect:  Comfortable , Happy , Calm    CSW Assessment:  CSW received request for consult due to MOB presenting with a history of anxiety and depression.  MOB provided consent for her mother and her daughter to remain in the room during the assessment.  MOB displayed a full range in affect, presented in a pleasant mood, and was easily engaged in the visit.  MOB did not present with acute mental health symptoms, nor did her thought process exhibit any signs or symptoms.   MOB expressed feelings of happiness secondary to the infant's birth. She stated that the labor and delivery was "fast", and shared that the FOB was unable to arrive to the hospital in time.  MOB denied any residual feelings secondary to the fast birth and his absence,  since the infant is healthy and they will now be able to transition home together. MOB expressed hope of an early discharge today, and shared that she feels ready to go home.  MOB endorsed presence of a strong support system.  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 MOB reported that it is her form of self-care to focus on her children.    MOB confirmed history of depression and anxiety, but stated that it was "situational" to the death of her grandmother. MOB confirmed that she was previously prescribed Zoloft and found it helpful as she navigated grief and loss. MOB denied history of perinatal mood disorders, but acknowledged the commonality of symptoms. MOB agreed to closely monitor her mood and to notify her medical provider if she notes onset of symptoms.   MOB denied questions, concerns, or needs at this time. She expressed appreciation for the visit, and agreed to notify CSW if additional needs arise.   CSW Plan/Description:   1. Patient/Family Education-- perinatal mood and anxiety disorders  2. No Further Intervention Required/No Barriers to Discharge    Kelby FamVenning, Tulsi Crossett N, LCSW 03/31/2015, 10:27 AM

## 2015-03-31 NOTE — Progress Notes (Signed)
Subjective: Postpartum Day 1: Vaginal delivery, 1st degree perineal laceration--gestational hypertension Patient up ad lib, reports no syncope or dizziness.  Denies HA, visual sx, or epigastric pain. Feeding:  Bottle feeding Contraceptive plan:  Vasectomy  Desired d/c today--OK'd by peds.  Objective: Vital signs in last 24 hours: Temp:  [97.9 F (36.6 C)-98.6 F (37 C)] 97.9 F (36.6 C) (01/04 0500) Pulse Rate:  [71-101] 101 (01/04 1056) Resp:  [16-20] 16 (01/04 0500) BP: (109-164)/(73-111) 109/92 mmHg (01/04 1056) SpO2:  [97 %] 97 % (01/03 1535)   Filed Vitals:   03/30/15 2016 03/31/15 0500 03/31/15 0931 03/31/15 1056  BP: 149/88 140/80 155/101 109/92  Pulse: 96 87 95 101  Temp: 98.4 F (36.9 C) 97.9 F (36.6 C)    TempSrc: Oral Oral    Resp: 16 16    Height:      Weight:      SpO2:        Physical Exam:  General: alert Lochia: appropriate Uterine Fundus: firm Perineum: healing well DVT Evaluation: No evidence of DVT seen on physical exam. Negative Homan's sign. Calf/Ankle edema is present, 1+, DTR 2+, no clonus   CBC Latest Ref Rng 03/31/2015 03/30/2015 03/30/2015  WBC 4.0 - 10.5 K/uL 14.2(H) 10.7(H) 10.4  Hemoglobin 12.0 - 15.0 g/dL 9.6(E8.9(L) 4.5(W9.8(L) 10.9(L)  Hematocrit 36.0 - 46.0 % 27.2(L) 29.7(L) 32.3(L)  Platelets 150 - 400 K/uL 241 268 281   Orthostatics stable.   Assessment/Plan: Status post vaginal delivery day 1. Gestational hypertension--hx HAs with HCTZ in past Anemia without hemodynamic instability. Stable Continue current care. Labetalol 200 mg po BID Smart Start to see patient in next 24-48 hours for BP check. If unable to see patient during that time, will need to be seen in office by Thursday. D/C home.     Nyra CapesLATHAM, VICKICNM 03/31/2015, 12:13 PM

## 2015-03-31 NOTE — Anesthesia Postprocedure Evaluation (Signed)
Anesthesia Post Note  Patient: Mary Gross  Procedure(s) Performed: * No procedures listed *  Patient location during evaluation: Mother Baby Anesthesia Type: Epidural Level of consciousness: awake Pain management: pain level controlled Vital Signs Assessment: post-procedure vital signs reviewed and stable Respiratory status: spontaneous breathing Cardiovascular status: stable Postop Assessment: no headache, no backache, epidural receding, patient able to bend at knees, no signs of nausea or vomiting and adequate PO intake Anesthetic complications: no    Last Vitals:  Filed Vitals:   03/30/15 2016 03/31/15 0500  BP: 149/88 140/80  Pulse: 96 87  Temp: 36.9 C 36.6 C  Resp: 16 16    Last Pain:  Filed Vitals:   03/31/15 0512  PainSc: 2                  Desia Saban

## 2015-03-31 NOTE — Discharge Instructions (Signed)
Postpartum Care After Vaginal Delivery °After you deliver your newborn (postpartum period), the usual stay in the hospital is 24-72 hours. If there were problems with your labor or delivery, or if you have other medical problems, you might be in the hospital longer.  °While you are in the hospital, you will receive help and instructions on how to care for yourself and your newborn during the postpartum period.  °While you are in the hospital: °· Be sure to tell your nurses if you have pain or discomfort, as well as where you feel the pain and what makes the pain worse. °· If you had an incision made near your vagina (episiotomy) or if you had some tearing during delivery, the nurses may put ice packs on your episiotomy or tear. The ice packs may help to reduce the pain and swelling. °· If you are breastfeeding, you may feel uncomfortable contractions of your uterus for a couple of weeks. This is normal. The contractions help your uterus get back to normal size. °· It is normal to have some bleeding after delivery. °· For the first 1-3 days after delivery, the flow is red and the amount may be similar to a period. °· It is common for the flow to start and stop. °· In the first few days, you may pass some small clots. Let your nurses know if you begin to pass large clots or your flow increases. °· Do not  flush blood clots down the toilet before having the nurse look at them. °· During the next 3-10 days after delivery, your flow should become more watery and pink or brown-tinged in color. °· Ten to fourteen days after delivery, your flow should be a small amount of yellowish-white discharge. °· The amount of your flow will decrease over the first few weeks after delivery. Your flow may stop in 6-8 weeks. Most women have had their flow stop by 12 weeks after delivery. °· You should change your sanitary pads frequently. °· Wash your hands thoroughly with soap and water for at least 20 seconds after changing pads, using  the toilet, or before holding or feeding your newborn. °· You should feel like you need to empty your bladder within the first 6-8 hours after delivery. °· In case you become weak, lightheaded, or faint, call your nurse before you get out of bed for the first time and before you take a shower for the first time. °· Within the first few days after delivery, your breasts may begin to feel tender and full. This is called engorgement. Breast tenderness usually goes away within 48-72 hours after engorgement occurs. You may also notice milk leaking from your breasts. If you are not breastfeeding, do not stimulate your breasts. Breast stimulation can make your breasts produce more milk. °· Spending as much time as possible with your newborn is very important. During this time, you and your newborn can feel close and get to know each other. Having your newborn stay in your room (rooming in) will help to strengthen the bond with your newborn.  It will give you time to get to know your newborn and become comfortable caring for your newborn. °· Your hormones change after delivery. Sometimes the hormone changes can temporarily cause you to feel sad or tearful. These feelings should not last more than a few days. If these feelings last longer than that, you should talk to your caregiver. °· If desired, talk to your caregiver about methods of family planning or contraception. °·   Talk to your caregiver about immunizations. Your caregiver may want you to have the following immunizations before leaving the hospital: °· Tetanus, diphtheria, and pertussis (Tdap) or tetanus and diphtheria (Td) immunization. It is very important that you and your family (including grandparents) or others caring for your newborn are up-to-date with the Tdap or Td immunizations. The Tdap or Td immunization can help protect your newborn from getting ill. °· Rubella immunization. °· Varicella (chickenpox) immunization. °· Influenza immunization. You should  receive this annual immunization if you did not receive the immunization during your pregnancy. °  °This information is not intended to replace advice given to you by your health care provider. Make sure you discuss any questions you have with your health care provider. °  °Document Released: 01/08/2007 Document Revised: 12/06/2011 Document Reviewed: 11/08/2011 °Elsevier Interactive Patient Education ©2016 Elsevier Inc. ° °Hypertension During Pregnancy °Hypertension, or high blood pressure, is when there is extra pressure inside your blood vessels that carry blood from the heart to the rest of your body (arteries). It can happen at any time in life, including pregnancy. Hypertension during pregnancy can cause problems for you and your baby. Your baby might not weigh as much as he or she should at birth or might be born early (premature). Very bad cases of hypertension during pregnancy can be life-threatening.  °Different types of hypertension can occur during pregnancy. These include: °· Chronic hypertension. This happens when a woman has hypertension before pregnancy and it continues during pregnancy. °· Gestational hypertension. This is when hypertension develops during pregnancy. °· Preeclampsia or toxemia of pregnancy. This is a very serious type of hypertension that develops only during pregnancy. It affects the whole body and can be very dangerous for both mother and baby.   °Gestational hypertension and preeclampsia usually go away after your baby is born. Your blood pressure will likely stabilize within 6 weeks. Women who have hypertension during pregnancy have a greater chance of developing hypertension later in life or with future pregnancies. °RISK FACTORS °There are certain factors that make it more likely for you to develop hypertension during pregnancy. These include: °· Having hypertension before pregnancy. °· Having hypertension during a previous pregnancy. °· Being overweight. °· Being older than 40  years. °· Being pregnant with more than one baby. °· Having diabetes or kidney problems. °SIGNS AND SYMPTOMS °Chronic and gestational hypertension rarely cause symptoms. Preeclampsia has symptoms, which may include: °· Increased protein in your urine. Your health care provider will check for this at every prenatal visit. °· Swelling of your hands and face. °· Rapid weight gain. °· Headaches. °· Visual changes. °· Being bothered by light. °· Abdominal pain, especially in the upper right area. °· Chest pain. °· Shortness of breath. °· Increased reflexes. °· Seizures. These occur with a more severe form of preeclampsia, called eclampsia. °DIAGNOSIS  °You may be diagnosed with hypertension during a regular prenatal exam. At each prenatal visit, you may have: °· Your blood pressure checked. °· A urine test to check for protein in your urine. °The type of hypertension you are diagnosed with depends on when you developed it. It also depends on your specific blood pressure reading. °· Developing hypertension before 20 weeks of pregnancy is consistent with chronic hypertension. °· Developing hypertension after 20 weeks of pregnancy is consistent with gestational hypertension. °· Hypertension with increased urinary protein is diagnosed as preeclampsia. °· Blood pressure measurements that stay above 160 systolic or 110 diastolic are a sign of severe preeclampsia. °TREATMENT °  Treatment for hypertension during pregnancy varies. Treatment depends on the type of hypertension and how serious it is. °· If you take medicine for chronic hypertension, you may need to switch medicines. °¨ Medicines called ACE inhibitors should not be taken during pregnancy. °¨ Low-dose aspirin may be suggested for women who have risk factors for preeclampsia. °· If you have gestational hypertension, you may need to take a blood pressure medicine that is safe during pregnancy. Your health care provider will recommend the correct medicine. °· If you have  severe preeclampsia, you may need to be in the hospital. Health care providers will watch you and your baby very closely. You also may need to take medicine called magnesium sulfate to prevent seizures and lower blood pressure. °· Sometimes, an early delivery is needed. This may be the case if the condition worsens. It would be done to protect you and your baby. The only cure for preeclampsia is delivery. °· Your health care provider may recommend that you take one low-dose aspirin (81 mg) each day to help prevent high blood pressure during your pregnancy if you are at risk for preeclampsia. You may be at risk for preeclampsia if: °¨ You had preeclampsia or eclampsia during a previous pregnancy. °¨ Your baby did not grow as expected during a previous pregnancy. °¨ You experienced preterm birth with a previous pregnancy. °¨ You experienced a separation of the placenta from the uterus (placental abruption) during a previous pregnancy. °¨ You experienced the loss of your baby during a previous pregnancy. °¨ You are pregnant with more than one baby. °¨ You have other medical conditions, such as diabetes or an autoimmune disease. °HOME CARE INSTRUCTIONS °· Schedule and keep all of your regular prenatal care appointments. This is important. °· Take medicines only as directed by your health care provider. Tell your health care provider about all medicines you take. °· Eat as little salt as possible. °· Get regular exercise. °· Do not drink alcohol. °· Do not use tobacco products. °· Do not drink products with caffeine. °· Lie on your left side when resting. °SEEK IMMEDIATE MEDICAL CARE IF: °· You have severe abdominal pain. °· You have sudden swelling in your hands, ankles, or face. °· You gain 4 pounds (1.8 kg) or more in 1 week. °· You vomit repeatedly. °· You have vaginal bleeding. °· You do not feel your baby moving as much. °· You have a headache. °· You have blurred or double vision. °· You have muscle twitching or  spasms. °· You have shortness of breath. °· You have blue fingernails or lips. °· You have blood in your urine. °MAKE SURE YOU: °· Understand these instructions. °· Will watch your condition. °· Will get help right away if you are not doing well or get worse. °  °This information is not intended to replace advice given to you by your health care provider. Make sure you discuss any questions you have with your health care provider. °  °Document Released: 11/29/2010 Document Revised: 04/03/2014 Document Reviewed: 10/10/2012 °Elsevier Interactive Patient Education ©2016 Elsevier Inc. ° °

## 2015-03-31 NOTE — Discharge Summary (Signed)
OB Discharge Summary     Patient Name: Mary Gross DOB: January 14, 1976 MRN: [id number]  Date of admission: 03/30/2015 Delivering MD: Nigel BridgemanLATHAM, Jaliyah Fotheringham   Date of discharge: 03/31/2015  Admitting diagnosis: INDUCTION Intrauterine pregnancy: 646w3d     Secondary diagnosis:  Principal Problem:   Vaginal delivery Active Problems:   LGA (large for gestational age) fetus   AMA (advanced maternal age) multigravida 35+--declined testing   Positive GBS test   Allergy to penicillin   Allergy to sulfa drugs   Gestational hypertension   Anxiety disorder   Allergy history, drug--HCTZ   Late prenatal care--at 15 weeks.  Additional problems: None     Discharge diagnosis: Term Pregnancy Delivered and Gestational Hypertension                                                                                                Post partum procedures:Initiated Labetalol 200 mg po BID  Augmentation: AROM and Pitocin  Complications: None  Hospital course:  Induction of Labor With Vaginal Delivery   40 y.o. yo [id number] at 176w3d was admitted to the hospital 03/30/2015 for induction of labor.  Indication for induction: Hx LGA, gestational hypertension, favorable cervix, GBS positive, hx rapid labor.  Patient had an uncomplicated labor course as follows: Membrane Rupture Time/Date: 1:12 PM ,03/30/2015   Intrapartum Procedures: Episiotomy: None [1]                                         Lacerations:  1st degree [2];Perineal [11]  Patient had delivery of a Viable infant.  Information for the patient's newborn:  Elise BenneCrouch, Boy Vandana [[id number]][[id number]]  Delivery Method: Vag-Spont   03/30/2015  Details of delivery can be found in separate delivery note.  Patient had a routine postpartum course. Patient is discharged home 03/31/2015. [facility] RN unable to schedule f/u visit this week--patient started on Labetalol 200 mg po BID on day of d/c due to persistent BPs of 140s-150s/80s-90s.   Will come to office on Friday for BP  check, then again in 1 week. PIH precautions reviewed at d/c.   Physical exam  Filed Vitals:   03/30/15 2016 03/31/15 0500 03/31/15 0931 03/31/15 1056  BP: 149/88 140/80 155/101 109/92  Pulse: 96 87 95 101  Temp: 98.4 F (36.9 C) 97.9 F (36.6 C)    TempSrc: Oral Oral    Resp: 16 16    Height:      Weight:      SpO2:       General: alert Lochia: appropriate Uterine Fundus: firm Incision: Healing well with no significant drainage DVT Evaluation: No evidence of DVT seen on physical exam. DTR 2+, no clonus, 1+ edema  Labs: CBC Latest Ref Rng 03/31/2015 03/30/2015 03/30/2015  WBC 4.0 - 10.5 K/uL 14.2(H) 10.7(H) 10.4  Hemoglobin 12.0 - 15.0 g/dL 2.4(M8.9(L) 0.1(U9.8(L) 10.9(L)  Hematocrit 36.0 - 46.0 % 27.2(L) 29.7(L) 32.3(L)  Platelets 150 - 400 K/uL 241 268 281    CMP Latest Ref Rng 03/30/2015  Glucose 65 - 99 mg/dL 161(W)  BUN 6 - 20 mg/dL 5(L)  Creatinine 9.60 - 1.00 mg/dL 4.54  Sodium 098 - 119 mmol/L 137  Potassium 3.5 - 5.1 mmol/L 3.3(L)  Chloride 101 - 111 mmol/L 105  CO2 22 - 32 mmol/L 21(L)  Calcium 8.9 - 10.3 mg/dL 9.3  Total Protein 6.5 - 8.1 g/dL 6.1(L)  Total Bilirubin 0.3 - 1.2 mg/dL 0.5  Alkaline Phos 38 - 126 U/L 114  AST 15 - 41 U/L 18  ALT 14 - 54 U/L 9(L)   PCR 0.16  Discharge instruction: per After Visit Summary and "Baby and Me Booklet".  After visit meds:    Medication List    TAKE these medications        ibuprofen 600 MG tablet  Commonly known as:  ADVIL,MOTRIN  Take 1 tablet (600 mg total) by mouth every 6 (six) hours.     labetalol 200 MG tablet  Commonly known as:  NORMODYNE  Take 1 tablet (200 mg total) by mouth 2 (two) times daily.     prenatal multivitamin Tabs tablet  Take 1 tablet by mouth daily at 12 noon.        Diet: routine diet  Activity: Advance as tolerated. Pelvic rest for 6 weeks.   Outpatient follow JY:[facility] on 04/02/15 for BP recheck, then again in 1 week Follow up Appt:No future appointments. Follow up Visit:No  Follow-up on file.  Postpartum contraception: Vasectomy  Newborn Data: Live born female  Birth Weight: 9 lb 8.6 oz (4325 g) APGAR: 9, 9  Baby Feeding: Bottle Disposition:home with mother   03/31/2015 Nigel Bridgeman, CNM

## 2015-04-01 ENCOUNTER — Encounter: Payer: Self-pay | Admitting: Family Medicine

## 2015-04-01 ENCOUNTER — Ambulatory Visit (INDEPENDENT_AMBULATORY_CARE_PROVIDER_SITE_OTHER): Payer: 59 | Admitting: Family Medicine

## 2015-04-01 VITALS — BP 135/85 | HR 87 | Temp 98.1°F | Resp 16 | Ht 66.5 in | Wt 195.5 lb

## 2015-04-01 DIAGNOSIS — O133 Gestational [pregnancy-induced] hypertension without significant proteinuria, third trimester: Secondary | ICD-10-CM

## 2015-04-01 LAB — COMPREHENSIVE METABOLIC PANEL
ALK PHOS: 87 U/L (ref 39–117)
ALT: 7 U/L (ref 0–35)
AST: 12 U/L (ref 0–37)
Albumin: 2.9 g/dL — ABNORMAL LOW (ref 3.5–5.2)
BILIRUBIN TOTAL: 0.4 mg/dL (ref 0.2–1.2)
BUN: 9 mg/dL (ref 6–23)
CALCIUM: 9.4 mg/dL (ref 8.4–10.5)
CO2: 28 meq/L (ref 19–32)
CREATININE: 0.63 mg/dL (ref 0.40–1.20)
Chloride: 106 mEq/L (ref 96–112)
GFR: 111.23 mL/min (ref >60.00)
GLUCOSE: 83 mg/dL (ref 70–99)
Potassium: 4.3 mEq/L (ref 3.5–5.1)
Sodium: 140 mEq/L (ref 135–145)
TOTAL PROTEIN: 5.5 g/dL — AB (ref 6.0–8.3)

## 2015-04-01 LAB — CBC WITH DIFFERENTIAL/PLATELET
BASOS ABS: 0 10*3/uL (ref 0.0–0.1)
Basophils Relative: 0.3 % (ref 0.0–3.0)
EOS ABS: 0.1 10*3/uL (ref 0.0–0.7)
Eosinophils Relative: 0.6 % (ref 0.0–5.0)
HEMATOCRIT: 27 % — AB (ref 36.0–46.0)
Hemoglobin: 8.9 g/dL — ABNORMAL LOW (ref 12.0–15.0)
LYMPHS PCT: 11.1 % — AB (ref 12.0–46.0)
Lymphs Abs: 1.7 10*3/uL (ref 0.7–4.0)
MCHC: 33.2 g/dL (ref 30.0–36.0)
MCV: 89.4 fl (ref 78.0–100.0)
MONOS PCT: 4 % (ref 3.0–12.0)
Monocytes Absolute: 0.6 10*3/uL (ref 0.1–1.0)
NEUTROS PCT: 84 % — AB (ref 43.0–77.0)
Neutro Abs: 12.7 10*3/uL — ABNORMAL HIGH (ref 1.4–7.7)
Platelets: 257 10*3/uL (ref 150.0–400.0)
RBC: 3.01 Mil/uL — AB (ref 3.87–5.11)
RDW: 14.2 % (ref 11.5–15.5)
WBC: 15.1 10*3/uL — AB (ref 4.0–10.5)

## 2015-04-01 NOTE — Progress Notes (Signed)
Pre visit review using our clinic review tool, if applicable. No additional management support is needed unless otherwise documented below in the visit note. 

## 2015-04-01 NOTE — Progress Notes (Signed)
OFFICE VISIT  04/01/2015   CC:  Chief Complaint  Patient presents with  . Follow-up    BP per OB/GYN   HPI:    Patient is a 40 y.o. Caucasian female who presents for f/u pregnancy induced HTN, question of history of stage 1 HTN in the past (see PMH comments).  Approx wt gained with this pregnancy was 22 lbs. She has been on labetalol 200 mg bid: started 03/30/15, the same day she delivered her infant son.   Her bp had been borderline a few days prior at her last OB check but prior to that it had been normal all through her pregnancy.  Possible lightheadedness/ringing in ears for 10 min or so after taking med---sounds mild.  No SOB.  Has mild HA --says she feels like this is from sleep deprivation over the last few weeks.  No visual complaints.  She feels like she is urinating a lot but says this is not unusual for her.  Past Medical History  Diagnosis Date  . Depression     after pregnancy loss was on zoloft  . Complication of anesthesia     leg weakness for 1 year after epidural in 2001  . AMA (advanced maternal age) multigravida 35+   . Hypertension     during pregnancy, + white coat (trial of enalapril  + ACE/HCTZ caused bp too low/sluggish.  Repeated home bp measurements normal.  . Anxiety     zoloft 25mg  helps a lot  . Pregnancy-induced hypertension, history with 1st preg 05/09/2011  . Lumbar spondylosis 07/27/11    with grade I spondylolisthesis (x-ray at chiropracter's office)    Past Surgical History  Procedure Laterality Date  . Cystectomy  2007    vulvar cyst  . Eye surgery      lasik  . Bartholin gland cyst excision      7-8 yrs ago  . Cholecystectomy  07/07/2011    Procedure: LAPAROSCOPIC CHOLECYSTECTOMY;  Surgeon: Dalia Heading, MD;  Location: [facility];  Service: General;  Laterality: N/A;    Outpatient Prescriptions Prior to Visit  Medication Sig Dispense Refill  . ibuprofen (ADVIL,MOTRIN) 600 MG tablet Take 1 tablet (600 mg total) by mouth every 6 (six) hours. 30  tablet 0  . labetalol (NORMODYNE) 200 MG tablet Take 1 tablet (200 mg total) by mouth 2 (two) times daily. 60 tablet 2  . Prenatal Vit-Fe Fumarate-FA (PRENATAL MULTIVITAMIN) TABS tablet Take 1 tablet by mouth daily at 12 noon.     No facility-administered medications prior to visit.    Allergies  Allergen Reactions  . Hctz [Hydrochlorothiazide] Other (See Comments)    Malaise/HA/orthostatic sx's  . Codeine Rash  . Iron Rash  . Penicillins Rash    Has patient had a PCN reaction causing immediate rash, facial/tongue/throat swelling, SOB or lightheadedness with hypotension: No Has patient had a PCN reaction causing severe rash involving mucus membranes or skin necrosis: No Has patient had a PCN reaction that required hospitalization Yes Has patient had a PCN reaction occurring within the last 10 years: No If all of the above answers are "NO", then may proceed with Cephalosporin use.     . Sulfa Antibiotics Rash    ROS As per HPI  PE: Blood pressure 135/85, pulse 87, temperature 98.1 F (36.7 C), temperature source Oral, resp. rate 16, height 5' 6.5" (1.689 m), weight 195 lb 8 oz (88.678 kg), last menstrual period 06/27/2014, SpO2 98 %, unknown if currently breastfeeding. Gen: Alert, well  appearing.  Patient is oriented to person, place, time, and situation. UJW:JXBJENT:Eyes: no injection, icteris, swelling, or exudate.  EOMI, PERRLA. Mouth: lips without lesion/swelling.  Oral mucosa pink and moist. Oropharynx without erythema, exudate, or swelling.  CV: RRR, soft systolic flow murmur, without r/g.   LUNGS: CTA bilat, nonlabored resps, good aeration in all lung fields. EXT: 2+ pitting edema in both LL's.   LABS:    Chemistry      Component Value Date/Time   NA 137 03/30/2015 0748   K 3.3* 03/30/2015 0748   CL 105 03/30/2015 0748   CO2 21* 03/30/2015 0748   BUN 5* 03/30/2015 0748   CREATININE 0.65 03/30/2015 0748      Component Value Date/Time   CALCIUM 9.3 03/30/2015 0748    ALKPHOS 114 03/30/2015 0748   AST 18 03/30/2015 0748   ALT 9* 03/30/2015 0748   BILITOT 0.5 03/30/2015 0748     Urine protein/cr ratio on 03/30/15 was 0.16 (upper limit of normal).  IMPRESSION AND PLAN:  Gestational HTN; will recheck CBC, CMET,and urine protein/cr ratio again today. She has no symptoms of preecclampsia.   If labs return normal then will d/c labetalol and have her monitor home bp's.  An After Visit Summary was printed and given to the patient.  FOLLOW UP:  To be determined based on results of pending workup.

## 2015-04-02 ENCOUNTER — Telehealth: Payer: Self-pay | Admitting: Family Medicine

## 2015-04-02 LAB — PROTEIN / CREATININE RATIO, URINE
Creatinine, Urine: 58 mg/dL (ref 20–320)
PROTEIN CREATININE RATIO: 207 mg/g{creat} — AB (ref 21–161)
Total Protein, Urine: 12 mg/dL (ref 5–24)

## 2015-04-02 NOTE — Telephone Encounter (Signed)
Pt advised and voiced understanding.  Apt made for 04/07/14 at 2:00pm.

## 2015-04-02 NOTE — Telephone Encounter (Signed)
Pls call pt and tell her that her labs returned stable. No further lab testing needed. After corresponding with Horald ChestnutVicky Latham, her GYN provider, I recommend she stay on the labetalol at current dosing and we'll likely have her on this indefinitely as treatment for chronic high blood pressure.  Pls have her make a f/u in 1 wk so we can recheck bp (office visit with me--15 min).-thx

## 2015-04-06 NOTE — Addendum Note (Signed)
Addendum  created 04/06/15 1503 by Cristela BlueKyle Ronique Simerly, MD   Modules edited: Anesthesia Events, Narrator   Narrator:  Narrator: Event Log Edited

## 2015-04-08 ENCOUNTER — Ambulatory Visit (INDEPENDENT_AMBULATORY_CARE_PROVIDER_SITE_OTHER): Payer: BLUE CROSS/BLUE SHIELD | Admitting: Family Medicine

## 2015-04-08 ENCOUNTER — Ambulatory Visit: Payer: BLUE CROSS/BLUE SHIELD | Admitting: Family Medicine

## 2015-04-08 ENCOUNTER — Encounter: Payer: Self-pay | Admitting: Family Medicine

## 2015-04-08 ENCOUNTER — Telehealth: Payer: Self-pay | Admitting: Family Medicine

## 2015-04-08 VITALS — BP 134/88 | HR 87 | Temp 99.4°F | Resp 16 | Ht 66.5 in | Wt 180.5 lb

## 2015-04-08 DIAGNOSIS — I10 Essential (primary) hypertension: Secondary | ICD-10-CM

## 2015-04-08 MED ORDER — LABETALOL HCL 300 MG PO TABS: 300.0000 mg | ORAL_TABLET | Freq: Two times a day (BID) | ORAL | Status: DC

## 2015-04-08 NOTE — Telephone Encounter (Signed)
Spoke to pt and she just wanted to make sure that she was advised of the urine results from 04/01/15. I advised her that Dr. Milinda CaveMcGowen said that all her labs were normal. She voiced understanding and was just a little concerned about the protein in her urine.   Is there anything else I should tell pt? Please advise. Thanks.

## 2015-04-08 NOTE — Telephone Encounter (Signed)
Patient has a question about a lab result that came through her MyChart. Please call her.

## 2015-04-08 NOTE — Progress Notes (Signed)
OFFICE VISIT  04/08/2015   CC:  Chief Complaint  Patient presents with  . Follow-up    BP   HPI:    Patient is a 40 y.o. Caucasian female who presents for 1 week f/u HTN. On labetalol: dose was 200 bid but due to elevated bp her GYN provider increased her dose a bit since I last saw her to 1 and 1/2 tabs qAM and 1 tab qpm. Home bp monitoring: checking bp 4-5 times per day, syst range 140-150, diast range 90s-110, HR 70s-80s. She is pretty much inactive since she has a NB baby. She is trying to work on improving her diet lately. No acute complaints except says she has a mild HA but says she was up with her baby a lot last night and she says if feels like the kind of HA she gets when she is sleep deprived.  Past Medical History  Diagnosis Date  . Depression     after pregnancy loss was on zoloft  . Complication of anesthesia     leg weakness for 1 year after epidural in 2001  . Hypertension     during pregnancy, + white coat (trial of enalapril  + ACE/HCTZ caused bp too low/sluggish.  Repeated home bp measurements normal.  . Anxiety     zoloft 25mg  helps a lot  . Pregnancy-induced hypertension, history with 1st preg 05/09/2011; 2015/16  . Lumbar spondylosis 07/27/11    with grade I spondylolisthesis (x-ray at chiropracter's office)    Past Surgical History  Procedure Laterality Date  . Cystectomy  2007    vulvar cyst  . Eye surgery      lasik  . Bartholin gland cyst excision      7-8 yrs ago  . Cholecystectomy  07/07/2011    Procedure: LAPAROSCOPIC CHOLECYSTECTOMY;  Surgeon: Dalia Heading, MD;  Location: [facility];  Service: General;  Laterality: N/A;    Outpatient Prescriptions Prior to Visit  Medication Sig Dispense Refill  . ibuprofen (ADVIL,MOTRIN) 600 MG tablet Take 1 tablet (600 mg total) by mouth every 6 (six) hours. 30 tablet 0  . labetalol (NORMODYNE) 200 MG tablet Take 1 tablet (200 mg total) by mouth 2 (two) times daily. 60 tablet 2  . Prenatal Vit-Fe Fumarate-FA  (PRENATAL MULTIVITAMIN) TABS tablet Take 1 tablet by mouth daily at 12 noon.     No facility-administered medications prior to visit.    Allergies  Allergen Reactions  . Hctz [Hydrochlorothiazide] Other (See Comments)    Malaise/HA/orthostatic sx's  . Codeine Rash  . Iron Rash  . Penicillins Rash    Has patient had a PCN reaction causing immediate rash, facial/tongue/throat swelling, SOB or lightheadedness with hypotension: No Has patient had a PCN reaction causing severe rash involving mucus membranes or skin necrosis: No Has patient had a PCN reaction that required hospitalization Yes Has patient had a PCN reaction occurring within the last 10 years: No If all of the above answers are "NO", then may proceed with Cephalosporin use.     . Sulfa Antibiotics Rash    ROS As per HPI  PE: Blood pressure 134/88, pulse 87, temperature 99.4 F (37.4 C), temperature source Oral, resp. rate 16, height 5' 6.5" (1.689 m), weight 180 lb 8 oz (81.874 kg), last menstrual period 06/27/2014, SpO2 97 %, unknown if currently breastfeeding. Gen: Alert, well appearing.  Patient is oriented to person, place, time, and situation. CV: RRR, soft systolic murmur at cardiac base, no diastolic murmur, S1 and  S2 distinct. LUNGS: CTA bilat, nonlabored resps. EXT: no clubbing, cyanosis, or edema.   LABS:  None today    Chemistry      Component Value Date/Time   NA 140 04/01/2015 1112   K 4.3 04/01/2015 1112   CL 106 04/01/2015 1112   CO2 28 04/01/2015 1112   BUN 9 04/01/2015 1112   CREATININE 0.63 04/01/2015 1112      Component Value Date/Time   CALCIUM 9.4 04/01/2015 1112   ALKPHOS 87 04/01/2015 1112   AST 12 04/01/2015 1112   ALT 7 04/01/2015 1112   BILITOT 0.4 04/01/2015 1112     Lab Results  Component Value Date   WBC 15.1* 04/01/2015   HGB 8.9* 04/01/2015   HCT 27.0* 04/01/2015   MCV 89.4 04/01/2015   PLT 257.0 04/01/2015   Protein/cr on 04/01/15 was 0.207 (normal)  IMPRESSION  AND PLAN:  1) Essential HTN; has hx of gestational HTN with several pregnancies and has had elevated bp's when not pregnant in the past.  A past bp med trial made her feel sluggish and/or have low bp readings (or both). Now doing ok/tolerating labetalol but we'll increase her dose a bit.  Encouraged her to check bp at home only 1-2 times a day for now, as I think she gets highly anxious when she checks it too frequently and this may affect her subsequent bp readings.   We'll increase her labetalol to 300 mg bid, have her call with bp report in 1 week, earlier if needed. DASH diet handout reviewed and given to patient.  An After Visit Summary was printed and given to the patient.  FOLLOW UP: Return for 6-8 weeks f/u HTN.

## 2015-04-08 NOTE — Telephone Encounter (Signed)
Will review with pt at o/v today.

## 2015-04-08 NOTE — Progress Notes (Signed)
Pre visit review using our clinic review tool, if applicable. No additional management support is needed unless otherwise documented below in the visit note. 

## 2015-04-09 ENCOUNTER — Ambulatory Visit: Payer: BLUE CROSS/BLUE SHIELD | Admitting: Family Medicine

## 2015-04-16 ENCOUNTER — Telehealth: Payer: Self-pay | Admitting: Family Medicine

## 2015-04-16 NOTE — Telephone Encounter (Signed)
Patient called to report BP readings for the week:  Starting date was 04/09/15- she states this is the day that she started higher dose of medication 04/09/15: 138/103 -  122/88 04/10/15: 126/91 -  121/86 04/11/15: 117/82 -  114/83 04/12/15:  117/80 - 112/76  04/13/15:  113/73 -  127/90 04/14/15: 108/78 -  116/77 04/15/15: 112/77 -  115/88 04/16/15: 112/78  Please advise if patient needs to continue to monitor BID or what she is to do next.  Thanks!

## 2015-04-16 NOTE — Telephone Encounter (Signed)
Pts mother Harriett Sine advised and voiced understanding, okay per DPR.

## 2015-04-16 NOTE — Telephone Encounter (Signed)
Continue to take current bp med dose and she can start checking bp only twice a week now.

## 2015-05-20 ENCOUNTER — Encounter: Payer: Self-pay | Admitting: Family Medicine

## 2015-05-20 ENCOUNTER — Ambulatory Visit (INDEPENDENT_AMBULATORY_CARE_PROVIDER_SITE_OTHER): Payer: BLUE CROSS/BLUE SHIELD | Admitting: Family Medicine

## 2015-05-20 VITALS — BP 112/76 | HR 75 | Temp 98.4°F | Resp 20 | Wt 177.5 lb

## 2015-05-20 DIAGNOSIS — I10 Essential (primary) hypertension: Secondary | ICD-10-CM | POA: Diagnosis not present

## 2015-05-20 NOTE — Progress Notes (Signed)
OFFICE VISIT  05/20/2015   CC:  Chief Complaint  Patient presents with  . Hypertension    follow up     HPI:    Patient is a 40 y.o. Caucasian female who presents for 6 wk f/u HTN.  Feeling well, home bp monitoring all normal for the last month or so.  No side effect from med.  Past Medical History  Diagnosis Date  . Depression     after pregnancy loss was on zoloft  . Complication of anesthesia     leg weakness for 1 year after epidural in 2001  . Hypertension     during pregnancy, + white coat (trial of enalapril  + ACE/HCTZ caused bp too low/sluggish.  Repeated home bp measurements normal.  . Anxiety     zoloft  helps a lot  . Pregnancy-induced hypertension, history with 1st preg 05/09/2011; 2015/16  . Lumbar spondylosis 07/27/11    with grade I spondylolisthesis (x-ray at chiropracter's office)    Past Surgical History  Procedure Laterality Date  . Cystectomy  2007    vulvar cyst  . Eye surgery      lasik  . Bartholin gland cyst excision      7-8 yrs ago  . Cholecystectomy  07/07/2011    Procedure: LAPAROSCOPIC CHOLECYSTECTOMY;  Surgeon: Dalia Heading, MD;  Location: [facility];  Service: General;  Laterality: N/A;    Outpatient Prescriptions Prior to Visit  Medication Sig Dispense Refill  . labetalol (NORMODYNE) 300 MG tablet Take 1 tablet (300 mg total) by mouth 2 (two) times daily. 60 tablet 6  . Prenatal Vit-Fe Fumarate-FA (PRENATAL MULTIVITAMIN) TABS tablet Take 1 tablet by mouth daily at 12 noon.    Marland Kitchen ibuprofen (ADVIL,MOTRIN) 600 MG tablet Take 1 tablet (600 mg total) by mouth every 6 (six) hours. 30 tablet 0   No facility-administered medications prior to visit.    Allergies  Allergen Reactions  . Hctz [Hydrochlorothiazide] Other (See Comments)    Malaise/HA/orthostatic sx's  . Codeine Rash  . Iron Rash  . Penicillins Rash    Has patient had a PCN reaction causing immediate rash, facial/tongue/throat swelling, SOB or lightheadedness with hypotension:  No Has patient had a PCN reaction causing severe rash involving mucus membranes or skin necrosis: No Has patient had a PCN reaction that required hospitalization Yes Has patient had a PCN reaction occurring within the last 10 years: No If all of the above answers are "NO", then may proceed with Cephalosporin use.     . Sulfa Antibiotics Rash    ROS As per HPI  PE: Blood pressure 112/76, pulse 75, temperature 98.4 F (36.9 C), resp. rate 20, weight 177 lb 8 oz (80.513 kg), SpO2 98 %, not currently breastfeeding. Gen: Alert, well appearing.  Patient is oriented to person, place, time, and situation. CV: RRR, no m/r/g.   LUNGS: CTA bilat, nonlabored resps, good aeration in all lung fields. EXT: no clubbing, cyanosis, or edema.    LABS:  None   Chemistry      Component Value Date/Time   NA 140 04/01/2015 1112   K 4.3 04/01/2015 1112   CL 106 04/01/2015 1112   CO2 28 04/01/2015 1112   BUN 9 04/01/2015 1112   CREATININE 0.63 04/01/2015 1112      Component Value Date/Time   CALCIUM 9.4 04/01/2015 1112   ALKPHOS 87 04/01/2015 1112   AST 12 04/01/2015 1112   ALT 7 04/01/2015 1112   BILITOT 0.4 04/01/2015 1112  IMPRESSION AND PLAN:  Essential HTN: The current medical regimen is effective;  continue present plan and medications.  An After Visit Summary was printed and given to the patient.  FOLLOW UP: Return in about 6 months (around 11/17/2015) for annual CPE (fasting).  Signed:  Santiago Bumpers, MD           05/20/2015

## 2015-09-29 ENCOUNTER — Ambulatory Visit (INDEPENDENT_AMBULATORY_CARE_PROVIDER_SITE_OTHER): Payer: 59 | Admitting: Family Medicine

## 2015-09-29 ENCOUNTER — Encounter: Payer: Self-pay | Admitting: Family Medicine

## 2015-09-29 VITALS — BP 110/80 | HR 85 | Temp 98.8°F | Resp 16 | Ht 66.5 in | Wt 179.0 lb

## 2015-09-29 DIAGNOSIS — R3915 Urgency of urination: Secondary | ICD-10-CM | POA: Diagnosis not present

## 2015-09-29 DIAGNOSIS — N3000 Acute cystitis without hematuria: Secondary | ICD-10-CM

## 2015-09-29 LAB — POCT URINALYSIS DIPSTICK
Bilirubin, UA: NEGATIVE
Glucose, UA: NEGATIVE
Ketones, UA: NEGATIVE
Leukocytes, UA: NEGATIVE
NITRITE UA: NEGATIVE
PH UA: 7.5
PROTEIN UA: NEGATIVE
RBC UA: NEGATIVE
SPEC GRAV UA: 1.015
UROBILINOGEN UA: 0.2

## 2015-09-29 MED ORDER — CIPROFLOXACIN HCL 500 MG PO TABS: 500.0000 mg | ORAL_TABLET | Freq: Two times a day (BID) | ORAL | Status: DC

## 2015-09-29 NOTE — Progress Notes (Signed)
Pre visit review using our clinic review tool, if applicable. No additional management support is needed unless otherwise documented below in the visit note. 

## 2015-09-29 NOTE — Progress Notes (Signed)
OFFICE VISIT  09/29/2015   CC:  Chief Complaint  Patient presents with  . Urinary Tract Infection    HA, lower back pain, burning when urinating x 3 days   HPI:    Patient is a 40 y.o. Caucasian female who presents for 3 day hx of burning in urethral area before and after urination, urinary frequency, low back pain, mild generalized HA, mild malaise.  No fever and no urinary urgency.  Slight nausea.   LMP was about 1 mo ago. Expects menses any time now.  Mom says she and her husband have not been sexually active in the "last months", so pregancy is "not possible"--per her words today. No lower abdominal dyscomfort.  No vag d/c or itching.   NO ST or URI sx's.    Past Medical History  Diagnosis Date  . Depression     after pregnancy loss was on zoloft  . Complication of anesthesia     leg weakness for 1 year after epidural in 2001  . Hypertension     during pregnancy, + white coat (trial of enalapril  + ACE/HCTZ caused bp too low/sluggish.  Repeated home bp measurements normal.  . Anxiety     zoloft 25mg  helps a lot  . Pregnancy-induced hypertension, history with 1st preg 05/09/2011; 2015/16  . Lumbar spondylosis 07/27/11    with grade I spondylolisthesis (x-ray at chiropracter's office)    Past Surgical History  Procedure Laterality Date  . Cystectomy  2007    vulvar cyst  . Eye surgery      lasik  . Bartholin gland cyst excision      7-8 yrs ago  . Cholecystectomy  07/07/2011    Procedure: LAPAROSCOPIC CHOLECYSTECTOMY;  Surgeon: Dalia HeadingMark A Jenkins, MD;  Location: [facility];  Service: General;  Laterality: N/A;    Outpatient Prescriptions Prior to Visit  Medication Sig Dispense Refill  . labetalol (NORMODYNE) 300 MG tablet Take 1 tablet (300 mg total) by mouth 2 (two) times daily. 60 tablet 6  . Prenatal Vit-Fe Fumarate-FA (PRENATAL MULTIVITAMIN) TABS tablet Take 1 tablet by mouth daily at 12 noon. Reported on 09/29/2015     No facility-administered medications prior to visit.     Allergies  Allergen Reactions  . Hctz [Hydrochlorothiazide] Other (See Comments)    Malaise/HA/orthostatic sx's  . Codeine Rash  . Iron Rash  . Penicillins Rash    Has patient had a PCN reaction causing immediate rash, facial/tongue/throat swelling, SOB or lightheadedness with hypotension: No Has patient had a PCN reaction causing severe rash involving mucus membranes or skin necrosis: No Has patient had a PCN reaction that required hospitalization Yes Has patient had a PCN reaction occurring within the last 10 years: No If all of the above answers are "NO", then may proceed with Cephalosporin use.     . Sulfa Antibiotics Rash    ROS As per HPI  PE: Blood pressure 110/80, pulse 85, temperature 98.8 F (37.1 C), temperature source Oral, resp. rate 16, height 5' 6.5" (1.689 m), weight 179 lb (81.194 kg), SpO2 100 %, not currently breastfeeding. Gen: Alert, well appearing.  Patient is oriented to person, place, time, and situation. WUJ:WJXBENT:Eyes: no injection, icteris, swelling, or exudate.  EOMI, PERRLA. Mouth: lips without lesion/swelling.  Oral mucosa pink and moist. Oropharynx without erythema, exudate, or swelling.  Neck - No masses or thyromegaly or limitation in range of motion CV: RRR, no m/r/g.   LUNGS: CTA bilat, nonlabored resps, good aeration in all lung  fields. No CVA or low back tenderness. EXT: no clubbing, cyanosis, or edema.   LABS:  CC UA today normal  IMPRESSION AND PLAN:  1) UTI suspected: her sx's match clinically, but UA is normal. Will start cipro 500 mg bid x 3d. Send urine for c/s.  An After Visit Summary was printed and given to the patient.  FOLLOW UP: Return if symptoms worsen or fail to improve.  Signed:  Santiago BumpersPhil McGowen, MD           09/29/2015

## 2015-09-30 LAB — URINE CULTURE: Colony Count: 40000

## 2015-10-01 ENCOUNTER — Telehealth: Payer: Self-pay | Admitting: Family Medicine

## 2015-10-01 MED ORDER — SERTRALINE HCL 50 MG PO TABS: 50.0000 mg | ORAL_TABLET | Freq: Every day | ORAL | Status: DC

## 2015-10-01 NOTE — Telephone Encounter (Signed)
Pt aware.  Rx sent to pharmacy, 90 day supply per patient.

## 2015-10-01 NOTE — Telephone Encounter (Signed)
Patient wants to know if you can restart her zoloft?  Patient was on this prior to getting pregnant.  Please advise.

## 2015-10-01 NOTE — Telephone Encounter (Signed)
Yes she can.

## 2015-10-04 ENCOUNTER — Telehealth: Payer: Self-pay | Admitting: Family Medicine

## 2015-10-04 MED ORDER — CIPROFLOXACIN HCL 500 MG PO TABS: 500.0000 mg | ORAL_TABLET | Freq: Two times a day (BID) | ORAL | Status: DC

## 2015-10-04 NOTE — Telephone Encounter (Signed)
Left detailed message on pt's cell.  Okay per DPR. 

## 2015-10-04 NOTE — Telephone Encounter (Signed)
Patient states she is still having symptoms of back pain and slight dysuria.  Please advise.

## 2015-10-04 NOTE — Telephone Encounter (Signed)
Tell her I want her to take 5 more days of cipro. I'll send the rx in.

## 2015-10-06 ENCOUNTER — Telehealth: Payer: Self-pay | Admitting: Family Medicine

## 2015-10-06 ENCOUNTER — Emergency Department (HOSPITAL_COMMUNITY)
Admission: EM | Admit: 2015-10-06 | Discharge: 2015-10-06 | Disposition: A | Discharge status: Home / Self Care | Payer: 59 | Attending: Emergency Medicine | Admitting: Emergency Medicine

## 2015-10-06 ENCOUNTER — Encounter (HOSPITAL_COMMUNITY): Payer: Self-pay | Admitting: Emergency Medicine

## 2015-10-06 DIAGNOSIS — I10 Essential (primary) hypertension: Secondary | ICD-10-CM | POA: Diagnosis not present

## 2015-10-06 DIAGNOSIS — T7840XA Allergy, unspecified, initial encounter: Secondary | ICD-10-CM

## 2015-10-06 DIAGNOSIS — Z79899 Other long term (current) drug therapy: Secondary | ICD-10-CM | POA: Diagnosis not present

## 2015-10-06 LAB — I-STAT CHEM 8, ED
BUN: 10 mg/dL (ref 6–20)
CALCIUM ION: 1.17 mmol/L (ref 1.13–1.30)
CHLORIDE: 104 mmol/L (ref 101–111)
Creatinine, Ser: 1 mg/dL (ref 0.44–1.00)
GLUCOSE: 100 mg/dL — AB (ref 65–99)
HCT: 35 % — ABNORMAL LOW (ref 36.0–46.0)
HEMOGLOBIN: 11.9 g/dL — AB (ref 12.0–15.0)
POTASSIUM: 3.5 mmol/L (ref 3.5–5.1)
SODIUM: 139 mmol/L (ref 135–145)
TCO2: 26 mmol/L (ref 0–100)

## 2015-10-06 LAB — URINALYSIS, ROUTINE W REFLEX MICROSCOPIC
BILIRUBIN URINE: NEGATIVE
GLUCOSE, UA: NEGATIVE mg/dL
KETONES UR: NEGATIVE mg/dL
NITRITE: NEGATIVE
PH: 6.5 (ref 5.0–8.0)
PROTEIN: NEGATIVE mg/dL
Specific Gravity, Urine: 1.01 (ref 1.005–1.030)

## 2015-10-06 LAB — URINE MICROSCOPIC-ADD ON

## 2015-10-06 LAB — I-STAT BETA HCG BLOOD, ED (MC, WL, AP ONLY)

## 2015-10-06 MED ORDER — ACETAMINOPHEN 500 MG PO TABS: 500.0000 mg | ORAL_TABLET | Freq: Four times a day (QID) | ORAL | Status: DC | PRN

## 2015-10-06 MED ORDER — DIPHENHYDRAMINE HCL 25 MG PO TABS: 25.0000 mg | ORAL_TABLET | Freq: Four times a day (QID) | ORAL | Status: DC

## 2015-10-06 MED ORDER — FAMOTIDINE 20 MG PO TABS: 20.0000 mg | ORAL_TABLET | Freq: Two times a day (BID) | ORAL | Status: DC

## 2015-10-06 MED ORDER — EPINEPHRINE 0.3 MG/0.3ML IJ SOAJ: 0.3000 mg | Freq: Once | INTRAMUSCULAR | Status: DC

## 2015-10-06 MED ORDER — LORAZEPAM 2 MG/ML IJ SOLN
0.5000 mg | Freq: Once | INTRAMUSCULAR | Status: AC
  Administered 2015-10-06: 0.5 mg via INTRAVENOUS
  Filled 2015-10-06: qty 1

## 2015-10-06 MED ORDER — LORATADINE 10 MG PO TABS: 10.0000 mg | ORAL_TABLET | Freq: Every day | ORAL | Status: DC

## 2015-10-06 MED ORDER — FAMOTIDINE IN NACL 20-0.9 MG/50ML-% IV SOLN
20.0000 mg | Freq: Once | INTRAVENOUS | Status: AC
  Administered 2015-10-06: 20 mg via INTRAVENOUS
  Filled 2015-10-06: qty 50

## 2015-10-06 MED ORDER — METHYLPREDNISOLONE SODIUM SUCC 125 MG IJ SOLR
125.0000 mg | Freq: Once | INTRAMUSCULAR | Status: AC
  Administered 2015-10-06: 125 mg via INTRAVENOUS
  Filled 2015-10-06: qty 2

## 2015-10-06 MED ORDER — PREDNISONE 10 MG PO TABS: 20.0000 mg | ORAL_TABLET | Freq: Every day | ORAL | Status: DC

## 2015-10-06 MED ORDER — SODIUM CHLORIDE 0.9 % IV BOLUS (SEPSIS)
1000.0000 mL | Freq: Once | INTRAVENOUS | Status: AC
  Administered 2015-10-06: 1000 mL via INTRAVENOUS

## 2015-10-06 NOTE — Telephone Encounter (Signed)
Patient was seen in the ER last night for an allergic reaction. Hospital advised patient that she needed to make an appt today with her PCP. No appts are available, please advise.

## 2015-10-06 NOTE — ED Provider Notes (Signed)
CSN: [id number]     Arrival date & time 10/06/15  0243 History   First MD Initiated Contact with Patient 10/06/15 0248     Chief Complaint  Patient presents with  . Allergic Reaction     (Consider location/radiation/quality/duration/timing/severity/associated sxs/prior Treatment) HPI   Patient comes to the ER by EMS after acute onset of diffuse itching, dizziness, and hives. She is unaware of any allergies that she has. She states that she woke up to go to the restroom when this all started. She took 50 mg PO Benadryl at home. She did not have any nausea, vomiting or diarrhea. She has not had any SOB, difficulty breathing or oral swelling. She overall is well appearing.  PCP: Jeoffrey MassedMCGOWEN,PHILIP H, MD [name] female39 y.o.   ROS: The patient denies confusion, diaphoresis, abdominal pains, N/V/D, gas, dysuria, abnormal  bleeding, genital discharge, fever, headache, weakness (general or focal), confusion, change of vision, dysphagia, aphagia, shortness of breath, lower extremity swelling, neck pain, chest pain, shortness of breath,  back pain.   Past Medical History  Diagnosis Date  . Depression     after pregnancy loss was on zoloft  . Complication of anesthesia     leg weakness for 1 year after epidural in 2001  . Hypertension     during pregnancy, + white coat (trial of enalapril  + ACE/HCTZ caused bp too low/sluggish.  Repeated home bp measurements normal.  . Anxiety     zoloft 25mg  helps a lot  . Pregnancy-induced hypertension, history with 1st preg 05/09/2011; 2015/16  . Lumbar spondylosis 07/27/11    with grade I spondylolisthesis (x-ray at chiropracter's office)   Past Surgical History  Procedure Laterality Date  . Cystectomy  2007    vulvar cyst  . Eye surgery      lasik  . Bartholin gland cyst excision      7-8 yrs ago  . Cholecystectomy  07/07/2011    Procedure: LAPAROSCOPIC CHOLECYSTECTOMY;  Surgeon: Dalia HeadingMark A Jenkins, MD;  Location: [facility];  Service: General;   Laterality: N/A;   Family History  Problem Relation Age of Onset  . Peripheral vascular disease Mother   . Depression Mother   . Hypertension Father   . Kidney disease Father     Bright's Disease as a child  . Hypertension Paternal Aunt   . Thyroid disease Maternal Grandmother   . Cancer Maternal Grandmother     bladder   . Stroke Paternal Grandmother   . Parkinsonism Paternal Grandmother   . Stroke Paternal Grandfather   . Anesthesia problems Neg Hx   . Hypotension Neg Hx   . Malignant hyperthermia Neg Hx   . Pseudochol deficiency Neg Hx   . Hypertension Maternal Uncle    Social History  Substance Use Topics  . Smoking status: Never Smoker   . Smokeless tobacco: Never Used  . Alcohol Use: No   OB History    Gravida Para Term Preterm AB TAB SAB Ectopic Multiple Living   6 4 4  2  2   0 4     Review of Systems  Review of Systems All other systems negative except as documented in the HPI. All pertinent positives and negatives as reviewed in the HPI.   Allergies  Hctz; Codeine; Iron; Penicillins; and Sulfa antibiotics  Home Medications   Prior to Admission medications   Medication Sig Start Date End Date Taking? Authorizing Provider  labetalol (NORMODYNE) 300 MG tablet Take 1 tablet (300 mg total) by  mouth 2 (two) times daily. Patient taking differently: Take 300 mg by mouth every morning.  04/08/15  Yes Jeoffrey Massed, MD  Multiple Vitamin (MULTIVITAMIN) tablet Take 1 tablet by mouth daily. [facility] packet   Yes Historical Provider, MD  omega-3 acid ethyl esters (LOVAZA) 1 g capsule Take 1 g by mouth daily.   Yes Historical Provider, MD  Probiotic Product (PROBIOTIC PO) Take 1 capsule by mouth daily.   Yes Historical Provider, MD  sertraline (ZOLOFT) 50 MG tablet Take 1 tablet (50 mg total) by mouth daily. 10/01/15  Yes Jeoffrey Massed, MD  acetaminophen (TYLENOL) 500 MG tablet Take 1 tablet (500 mg total) by mouth every 6 (six) hours as needed. 10/06/15   Gelisa Tieken Neva Seat,  PA-C  diphenhydrAMINE (BENADRYL) 25 MG tablet Take 1 tablet (25 mg total) by mouth every 6 (six) hours. 10/06/15   Mikayah Joy Neva Seat, PA-C  EPINEPHrine 0.3 mg/0.3 mL IJ SOAJ injection Inject 0.3 mLs (0.3 mg total) into the muscle once. 10/06/15   Tacy Chavis Neva Seat, PA-C  famotidine (PEPCID) 20 MG tablet Take 1 tablet (20 mg total) by mouth 2 (two) times daily. 10/06/15   Orren Pietsch Neva Seat, PA-C  loratadine (CLARITIN) 10 MG tablet Take 1 tablet (10 mg total) by mouth daily. 10/06/15   Jasemine Nawaz Neva Seat, PA-C  predniSONE (DELTASONE) 10 MG tablet Take 2 tablets (20 mg total) by mouth daily. 10/06/15   Leeah Politano Neva Seat, PA-C   BP 129/91 mmHg  Pulse 81  Temp(Src) 97.8 F (36.6 C) (Oral)  Resp 16  Ht 5\' 6"  (1.676 m)  Wt 80.74 kg  BMI 28.74 kg/m2  SpO2 97% Physical Exam  Constitutional: She appears well-developed and well-nourished. No distress.  HENT:  Head: Normocephalic and atraumatic.  Right Ear: Tympanic membrane and ear canal normal.  Left Ear: Tympanic membrane and ear canal normal.  Nose: Nose normal.  Mouth/Throat: Uvula is midline, oropharynx is clear and moist and mucous membranes are normal.  Eyes: Pupils are equal, round, and reactive to light.  Neck: Normal range of motion. Neck supple.  Cardiovascular: Normal rate and regular rhythm.   Pulmonary/Chest: Effort normal.  Abdominal: Soft. Bowel sounds are normal. There is no tenderness. There is no rebound and no CVA tenderness.  No signs of abdominal distention  Musculoskeletal:  No LE swelling  Neurological: She is alert.  Acting at baseline  Skin: Skin is warm and dry. No rash noted.  Psychiatric:  No rash  Nursing note and vitals reviewed.   ED Course  Procedures (including critical care time) Labs Review Labs Reviewed  I-STAT CHEM 8, ED - Abnormal; Notable for the following:    Glucose, Bld 100 (*)    Hemoglobin 11.9 (*)    HCT 35.0 (*)    All other components within normal limits  URINALYSIS, ROUTINE W REFLEX MICROSCOPIC (NOT  AT [facility])  I-STAT BETA HCG BLOOD, ED ([facility], [facility], [facility] ONLY)    Imaging Review No results found. I have personally reviewed and evaluated these images and lab results as part of my medical decision-making.   EKG Interpretation None      MDM   Final diagnoses:  Allergic reaction, initial encounter    Medications  methylPREDNISolone sodium succinate (SOLU-MEDROL) 125 mg/2 mL injection 125 mg (125 mg Intravenous Given 10/06/15 0421)  famotidine (PEPCID) IVPB 20 mg premix (0 mg Intravenous Stopped 10/06/15 0450)  sodium chloride 0.9 % bolus 1,000 mL (1,000 mLs Intravenous New Bag/Given 10/06/15 0421)  LORazepam (ATIVAN) injection 0.5 mg (0.5 mg Intravenous  Given 10/06/15 0421)   Complete resolution of symptoms. Patient re-evaluated prior to dc, is hemodynamically stable, in no respiratory distress, and denies the feeling of throat closing. Pt has been advised to take OTC benadryl & return to the ED if they have a mod-severe allergic rxn (s/s including throat closing, difficulty breathing, swelling of lips face or tongue). Pt is to follow up with their PCP. Pt is agreeable with plan & verbalizes understanding.  Rx: prednisone, epi pen, Claritin, benadryl and Pepcid.  Blood pressure 111/82, pulse 82, temperature 97.8 F (36.6 C), temperature source Oral, resp. rate 16, height  (1.676 m), weight 80.74 kg, SpO2 95 %, not currently breastfeeding.    Marlon Pel, PA-C 10/06/15 0532  Shon Baton, MD 10/06/15 [phone]

## 2015-10-06 NOTE — Discharge Instructions (Signed)

## 2015-10-06 NOTE — ED Notes (Signed)
Pt arrives via EMS from home with c/o sudden onset hives, itching, dizziness. States hx of sudden onset hives. Took 50MG  PO benadryl PTA. No IV access so no other drugs administered by ems. No hives visible upon arrival to room. Denies new soaps, detergents, creams,etc.   Recent tick bite, recently started taking Zoloft after giving birth, recent UTI with cipro but has finished course.

## 2015-10-06 NOTE — ED Notes (Signed)
Rounded on patient, states she feels better - denies any s/s of allergic reaction including hives, itching, etc. Denies nausea.

## 2015-10-06 NOTE — ED Notes (Signed)
Pt left at this time with all belongings.  

## 2015-10-06 NOTE — Telephone Encounter (Signed)
Per Dr. Milinda CaveMcGowen pt does not need to f/u with him today. She can come in tomorrow at 3:00pm for 30 minute f/u. Please call pt to schedule apt. Thanks.

## 2015-10-06 NOTE — Telephone Encounter (Signed)
Patient has been scheduled

## 2015-10-07 ENCOUNTER — Ambulatory Visit (INDEPENDENT_AMBULATORY_CARE_PROVIDER_SITE_OTHER): Payer: 59 | Admitting: Family Medicine

## 2015-10-07 ENCOUNTER — Encounter: Payer: Self-pay | Admitting: Family Medicine

## 2015-10-07 VITALS — BP 124/79 | HR 70 | Temp 98.3°F | Resp 16 | Ht 66.5 in | Wt 179.5 lb

## 2015-10-07 DIAGNOSIS — F411 Generalized anxiety disorder: Secondary | ICD-10-CM | POA: Diagnosis not present

## 2015-10-07 DIAGNOSIS — T7840XD Allergy, unspecified, subsequent encounter: Secondary | ICD-10-CM | POA: Diagnosis not present

## 2015-10-07 DIAGNOSIS — N3000 Acute cystitis without hematuria: Secondary | ICD-10-CM | POA: Diagnosis not present

## 2015-10-07 NOTE — Progress Notes (Signed)
Pre visit review using our clinic review tool, if applicable. No additional management support is needed unless otherwise documented below in the visit note. 

## 2015-10-07 NOTE — Patient Instructions (Signed)
Stop cipro, prednisone, claritin, pepcid, and benadryl.

## 2015-10-07 NOTE — Progress Notes (Signed)
OFFICE VISIT  10/07/2015   CC:  Chief Complaint  Patient presents with  . Follow-up    ER visit   HPI:    Patient is a 40 y.o. Caucasian female who presents for f/u for recent ER visit for rash/itching. Presented to [facility] ER in early morning hours of 10/06/15 for itchy, light reddish rash on thighs primarily (the patient admits the rash was very subtle and was definitely not hives).  It started burning and itching a lot.  She had taken benadryl 50mg  po at home prior to going to the ER.  She did not have lips, tongue, eyes, or throat swelling.  She had no wheezing, SOB, or CP.  She had no rash in the ED per their documentation, and she was given IV steroids, pepcid, ativan, and IV fluids in the ED.  She was d/c'd home with prednisone, claritin, benadryl and pepcid, and epi pen.  Dx was allergic rxn but no mention was made of possible culprit allergen.  On 09/29/15 I saw her for UTI sx's and gave her a 5d course of cipro.  Her urine clx showed multiple species, small growth, essentially looked like contaminated sample.  When we called to check on her she said she still had some of her urinary symptoms--although milder, so I rx'd another 5d of cipro.  She started the same burning and tingling skin sensation "all over" last night, so she took another benadryl.  She had restarted her cipro yesterday.   She currently has minimal dysuria and urgency, mild chronic frequency. No fevers. No back pain.  Her urine in the ED was normal.  Currently has no rash, no skin itching or burning. She notes she has been under lots of family stress lately, esp in her home.  Her mother, who accompanies her today, adds that pt's symptoms last evening came on after she had been at [facility] home all day and then went back to her own home.  She says she thinks the stress she feels in her home is got something to do with all this.   Past Medical History  Diagnosis Date  . Depression     after pregnancy loss was on zoloft  .  Complication of anesthesia     leg weakness for 1 year after epidural in 2001  . Hypertension     during pregnancy, + white coat (trial of enalapril  + ACE/HCTZ caused bp too low/sluggish.  Repeated home bp measurements normal.  . Anxiety     zoloft 25mg  helps a lot  . Pregnancy-induced hypertension, history with 1st preg 05/09/2011; 2015/16  . Lumbar spondylosis 07/27/11    with grade I spondylolisthesis (x-ray at chiropracter's office)    Past Surgical History  Procedure Laterality Date  . Cystectomy  2007    vulvar cyst  . Eye surgery      lasik  . Bartholin gland cyst excision      7-8 yrs ago  . Cholecystectomy  07/07/2011    Procedure: LAPAROSCOPIC CHOLECYSTECTOMY;  Surgeon: Dalia Heading, MD;  Location: [facility];  Service: General;  Laterality: N/A;    Outpatient Prescriptions Prior to Visit  Medication Sig Dispense Refill  . acetaminophen (TYLENOL) 500 MG tablet Take 1 tablet (500 mg total) by mouth every 6 (six) hours as needed. 30 tablet 0  . diphenhydrAMINE (BENADRYL) 25 MG tablet Take 1 tablet (25 mg total) by mouth every 6 (six) hours. 20 tablet 0  . EPINEPHrine 0.3 mg/0.3 mL IJ SOAJ  injection Inject 0.3 mLs (0.3 mg total) into the muscle once. 1 Device 1  . famotidine (PEPCID) 20 MG tablet Take 1 tablet (20 mg total) by mouth 2 (two) times daily. 30 tablet 0  . labetalol (NORMODYNE) 300 MG tablet Take 1 tablet (300 mg total) by mouth 2 (two) times daily. (Patient taking differently: Take 300 mg by mouth every morning. ) 60 tablet 6  . loratadine (CLARITIN) 10 MG tablet Take 1 tablet (10 mg total) by mouth daily. 7 tablet 0  . Multiple Vitamin (MULTIVITAMIN) tablet Take 1 tablet by mouth daily. [facility] packet    . omega-3 acid ethyl esters (LOVAZA) 1 g capsule Take 1 g by mouth daily.    . predniSONE (DELTASONE) 10 MG tablet Take 2 tablets (20 mg total) by mouth daily. 21 tablet 0  . Probiotic Product (PROBIOTIC PO) Take 1 capsule by mouth daily.    . sertraline (ZOLOFT) 50 MG  tablet Take 1 tablet (50 mg total) by mouth daily. 90 tablet 1   No facility-administered medications prior to visit.    Allergies  Allergen Reactions  . Hctz [Hydrochlorothiazide] Other (See Comments)    Malaise/HA/orthostatic sx's  . Codeine Rash  . Iron Rash  . Penicillins Rash    Has patient had a PCN reaction causing immediate rash, facial/tongue/throat swelling, SOB or lightheadedness with hypotension: No Has patient had a PCN reaction causing severe rash involving mucus membranes or skin necrosis: No Has patient had a PCN reaction that required hospitalization Yes Has patient had a PCN reaction occurring within the last 10 years: No If all of the above answers are "NO", then may proceed with Cephalosporin use.     . Sulfa Antibiotics Rash    ROS As per HPI  PE: Blood pressure 124/79, pulse 70, temperature 98.3 F (36.8 C), temperature source Oral, resp. rate 16, height 5' 6.5" (1.689 m), weight 179 lb 8 oz (81.421 kg), SpO2 98 %, not currently breastfeeding. Gen: Alert, well appearing.  Patient is oriented to person, place, time, and situation. HYQ:MVHQENT:Eyes: no injection, icteris, swelling, or exudate.  EOMI, PERRLA. Mouth: lips without lesion/swelling.  Oral mucosa pink and moist. Oropharynx without erythema, exudate, or swelling.  CV: RRR, no m/r/g.   LUNGS: CTA bilat, nonlabored resps, good aeration in all lung fields. Skin - no sores or suspicious lesions or rashes or color changes  LABS:    Chemistry      Component Value Date/Time   NA 139 10/06/2015 0418   K 3.5 10/06/2015 0418   CL 104 10/06/2015 0418   CO2 28 04/01/2015 1112   BUN 10 10/06/2015 0418   CREATININE 1.00 10/06/2015 0418      Component Value Date/Time   CALCIUM 9.4 04/01/2015 1112   ALKPHOS 87 04/01/2015 1112   AST 12 04/01/2015 1112   ALT 7 04/01/2015 1112   BILITOT 0.4 04/01/2015 1112     Urine micro 10/06/15 showed no RBCs or WBCs.  IMPRESSION AND PLAN:  1) Pruritic rash: her  symptoms are admittedly hard to interpret, esp given the fact that she seems to have had minimal rash.  She could be describing physical sx's of anxiety.  However, given the timing of pt having had a course of cipro, then not long after this she gets what sounds like an allergic skin reaction, it's hard not to blame all this on cipro. Plan is to stop cipro and also stop the prednisone and antihistamines she was rx'd by the ED. We'll  see how the course of this goes after getting off all the meds.   We did discuss s/s of allergic rxn that she would need to treat with benadryl and also discussed parameters for epi use. Signs/symptoms to call or return for were reviewed and pt expressed understanding.  2) UTI (?): I treated her based on her sx's, but her UAs and clx did not support this dx.  No further abx at this time.  She admits the sx's are improved and only mild at this time, even said they seem to be intermittent.  An After Visit Summary was printed and given to the patient.  Spent 30 min with pt today, with >50% of this time spent in counseling and care coordination regarding the above problems.  FOLLOW UP: Return if symptoms worsen or fail to improve.  Signed:  Santiago Bumpers, MD           10/07/2015

## 2015-11-01 ENCOUNTER — Telehealth: Payer: Self-pay | Admitting: Family Medicine

## 2015-11-01 NOTE — Telephone Encounter (Signed)
Patient aware.  She will keep f/u appt for 11/05/15.

## 2015-11-01 NOTE — Telephone Encounter (Signed)
Pt calling to report that she doesn't feel well after taking her sertraline (ZOLOFT) 50 MG tablet.  She has stopped taking it but wants to know if medication can be reduced to lower dosage.  Patient was scheduled to see PCP on Friday (11/05/15) to discuss with PCP.  However, she wants to know should she resume taking the mediation and maybe split it in half until she can see PCP on Friday.

## 2015-11-01 NOTE — Telephone Encounter (Signed)
Patient may take half tab of her antidepressant until follow up in office.

## 2015-11-01 NOTE — Telephone Encounter (Signed)
Please advise 

## 2015-11-05 ENCOUNTER — Encounter: Payer: Self-pay | Admitting: Family Medicine

## 2015-11-05 ENCOUNTER — Ambulatory Visit (INDEPENDENT_AMBULATORY_CARE_PROVIDER_SITE_OTHER): Payer: 59 | Admitting: Family Medicine

## 2015-11-05 VITALS — BP 133/88 | HR 71 | Temp 98.2°F | Resp 16 | Ht 66.5 in | Wt 177.0 lb

## 2015-11-05 DIAGNOSIS — F4323 Adjustment disorder with mixed anxiety and depressed mood: Secondary | ICD-10-CM

## 2015-11-05 DIAGNOSIS — F411 Generalized anxiety disorder: Secondary | ICD-10-CM

## 2015-11-05 DIAGNOSIS — I10 Essential (primary) hypertension: Secondary | ICD-10-CM | POA: Diagnosis not present

## 2015-11-05 MED ORDER — SERTRALINE HCL 100 MG PO TABS: 100.0000 mg | ORAL_TABLET | Freq: Every day | ORAL | 3 refills | Status: DC

## 2015-11-05 NOTE — Patient Instructions (Signed)
Take a whole  zoloft every night x 3 nights, then increase to 2 of the  sertraline tabs every night.

## 2015-11-05 NOTE — Progress Notes (Signed)
Pre visit review using our clinic review tool, if applicable. No additional management support is needed unless otherwise documented below in the visit note. 

## 2015-11-05 NOTE — Progress Notes (Signed)
OFFICE VISIT  11/05/2015   CC:  Chief Complaint  Patient presents with  . Follow-up    Pt is not fasting.    HPI:    Patient is a 40 y.o. Caucasian female who presents for f/u anxiety and HTN.  No home bp monitoring.  Taking only one labetalol  tab once daily. No HA's, no CP, no palpitations or SOB.  Anxiety: feels nervous and shaky, a nondescript tingly feeling in extremities at times.  These particular sx's are intermittent, occur mostly in her home, esp when her husband is around.  She says she has been having probs with her marriage "for quite some time".  All her anxiety stems from her poor relationship with her husband.  She has never had panic-type sx's in the past.  She denies depression, but gets sad when she is around husband. She says he stays upstairs, often blames her for how he feels, never talks to the children.  She and her kids spend a lot of time with her parents.  Pt is open to counseling but needs to wait about a month so her mom could watch her children.   Past Medical History:  Diagnosis Date  . Anxiety    zoloft  helps a lot  . Complication of anesthesia    leg weakness for 1 year after epidural in 2001  . Depression    after pregnancy loss was on zoloft  . Hypertension    during pregnancy, + white coat (trial of enalapril  + ACE/HCTZ caused bp too low/sluggish.  Repeated home bp measurements normal.  . Lumbar spondylosis 07/27/11   with grade I spondylolisthesis (x-ray at chiropracter's office)  . Pregnancy-induced hypertension, history with 1st preg 05/09/2011; 2015/16    Past Surgical History:  Procedure Laterality Date  . BARTHOLIN GLAND CYST EXCISION     7-8 yrs ago  . CHOLECYSTECTOMY  07/07/2011   Procedure: LAPAROSCOPIC CHOLECYSTECTOMY;  Surgeon: Dalia Heading, MD;  Location: [facility];  Service: General;  Laterality: N/A;  . CYSTECTOMY  2007   vulvar cyst  . EYE SURGERY     lasik    Outpatient Medications Prior to Visit  Medication  Sig Dispense Refill  . acetaminophen (TYLENOL) 500 MG tablet Take 1 tablet (500 mg total) by mouth every 6 (six) hours as needed. 30 tablet 0  . EPINEPHrine 0.3 mg/0.3 mL IJ SOAJ injection Inject 0.3 mLs (0.3 mg total) into the muscle once. 1 Device 1  . labetalol (NORMODYNE) 300 MG tablet Take 1 tablet (300 mg total) by mouth 2 (two) times daily. (Patient taking differently: Take 300 mg by mouth daily. ) 60 tablet 6  . Multiple Vitamin (MULTIVITAMIN) tablet Take 1 tablet by mouth daily. [facility] packet    . omega-3 acid ethyl esters (LOVAZA) 1 g capsule Take 1 g by mouth daily.    . Probiotic Product (PROBIOTIC PO) Take 1 capsule by mouth daily.    . sertraline (ZOLOFT) 50 MG tablet Take 1 tablet (50 mg total) by mouth daily. (Patient taking differently: Take 25 mg by mouth daily. ) 90 tablet 1   No facility-administered medications prior to visit.     Allergies  Allergen Reactions  . Hctz [Hydrochlorothiazide] Other (See Comments)    Malaise/HA/orthostatic sx's  . Ciprofloxacin Rash    Itchy/burning skin with MINIMAL rash per pt report  . Codeine Rash  . Iron Rash  . Penicillins Rash    Has patient had a PCN reaction causing immediate rash,  facial/tongue/throat swelling, SOB or lightheadedness with hypotension: No Has patient had a PCN reaction causing severe rash involving mucus membranes or skin necrosis: No Has patient had a PCN reaction that required hospitalization Yes Has patient had a PCN reaction occurring within the last 10 years: No If all of the above answers are "NO", then may proceed with Cephalosporin use.     . Sulfa Antibiotics Rash    ROS As per HPI  PE: Blood pressure 133/88, pulse 71, temperature 98.2 F (36.8 C), temperature source Oral, resp. rate 16, height 5' 6.5" (1.689 m), weight 177 lb (80.3 kg), last menstrual period 11/02/2015, SpO2 99 %, not currently breastfeeding. Gen: Alert, well appearing.  Patient is oriented to person, place, time, and  situation. AFFECT: pleasant, lucid thought and speech. Gets tearful at times when discussing probs with relationship with husband UJW:JXBJENT:Eyes: no injection, icteris, swelling, or exudate.  EOMI, PERRLA. Mouth: lips without lesion/swelling.  Oral mucosa pink and moist. Oropharynx without erythema, exudate, or swelling.  Neck - No masses or thyromegaly or limitation in range of motion CV: RRR, no m/r/g.   LUNGS: CTA bilat, nonlabored resps, good aeration in all lung fields. EXT: no clubbing, cyanosis, or edema.    LABS:  Lab Results  Component Value Date   TSH 2.73 01/21/2014     Chemistry      Component Value Date/Time   NA 139 10/06/2015 0418   K 3.5 10/06/2015 0418   CL 104 10/06/2015 0418   CO2 28 04/01/2015 1112   BUN 10 10/06/2015 0418   CREATININE 1.00 10/06/2015 0418      Component Value Date/Time   CALCIUM 9.4 04/01/2015 1112   ALKPHOS 87 04/01/2015 1112   AST 12 04/01/2015 1112   ALT 7 04/01/2015 1112   BILITOT 0.4 04/01/2015 1112       IMPRESSION AND PLAN:  1) Essential HTN; The current medical regimen is effective;  continue present plan and medications.  2) GAD, with superimposed adjustment d/o with anxiety and depression symptoms. Reassured pt that the med is not the cause of her sx's. Discussed increase in sertraline today: back to 50mg  qhs x 3d, then 2 of the 50mg  tabs qhs.  I sent in rx for 100mg  sertraline for her to take 1 qhs when she is out of her 50mg  tabs. Will discuss counseling referral at next f/u visit in 1 mo.  An After Visit Summary was printed and given to the patient.  FOLLOW UP: Return in about 4 weeks (around 12/03/2015) for f/u anxiety.  Signed:  Santiago BumpersPhil Cerra Eisenhower, MD           11/05/2015

## 2015-12-01 ENCOUNTER — Ambulatory Visit: Payer: 59 | Admitting: Family Medicine

## 2015-12-20 ENCOUNTER — Ambulatory Visit: Payer: 59

## 2015-12-30 ENCOUNTER — Ambulatory Visit (INDEPENDENT_AMBULATORY_CARE_PROVIDER_SITE_OTHER): Payer: 59

## 2015-12-30 DIAGNOSIS — Z23 Encounter for immunization: Secondary | ICD-10-CM

## 2016-01-19 ENCOUNTER — Encounter: Payer: Self-pay | Admitting: Family Medicine

## 2016-01-19 ENCOUNTER — Ambulatory Visit (INDEPENDENT_AMBULATORY_CARE_PROVIDER_SITE_OTHER): Payer: 59 | Admitting: Family Medicine

## 2016-01-19 VITALS — BP 134/89 | HR 74 | Temp 98.3°F | Resp 15 | Wt 173.1 lb

## 2016-01-19 DIAGNOSIS — R232 Flushing: Secondary | ICD-10-CM | POA: Diagnosis not present

## 2016-01-19 NOTE — Progress Notes (Signed)
Pre visit review using our clinic review tool, if applicable. No additional management support is needed unless otherwise documented below in the visit note. 

## 2016-01-19 NOTE — Progress Notes (Signed)
OFFICE VISIT  01/19/2016   CC:  Chief Complaint  Patient presents with  . Allergic Reaction    face feels hot then turns red- patient think allergic reaction   HPI:    Patient is a 40 y.o. Caucasian female who presents accompanied by her mother today for recent episodes of spontaneous feeling of her face feeling hot, then it turns bright red in a splotchy distribution on her face/ears.  It resolves in about 5 minutes.  Eyelids and lips don't swell.  No problems with throat swelling, no SOB.  This always happens during the day and it sounds like it has happened 3 or 4 different times, at a frequence of about 1 time per week.  No food or activity is consistently associated.   She is very stressed, going through separation from husband, moving her children and herself out of her house.    ROS: stomach hurts "sometimes", no diarrhea.  No cough or wheezing.  No headache.   Remainder of her skin does not have any burning or rash.  No nausea.  No dizziness.  No arthralgias or myalgias.  She takes a benadryl each time this flushing happens.   She is currently taking only 1/2 of a 100 mg sertraline tab for anxiety.   Past Medical History:  Diagnosis Date  . Anxiety    zoloft 25mg  helps a lot  . Complication of anesthesia    leg weakness for 1 year after epidural in 2001  . Depression    after pregnancy loss was on zoloft  . Hypertension    during pregnancy, + white coat (trial of enalapril  + ACE/HCTZ caused bp too low/sluggish.  Repeated home bp measurements normal.  . Lumbar spondylosis 07/27/11   with grade I spondylolisthesis (x-ray at chiropracter's office)  . Pregnancy-induced hypertension, history with 1st preg 05/09/2011; 2015/16    Past Surgical History:  Procedure Laterality Date  . BARTHOLIN GLAND CYST EXCISION     7-8 yrs ago  . CHOLECYSTECTOMY  07/07/2011   Procedure: LAPAROSCOPIC CHOLECYSTECTOMY;  Surgeon: Dalia HeadingMark A Jenkins, MD;  Location: [facility];  Service: General;   Laterality: N/A;  . CYSTECTOMY  2007   vulvar cyst  . EYE SURGERY     lasik    Outpatient Medications Prior to Visit  Medication Sig Dispense Refill  . acetaminophen (TYLENOL) 500 MG tablet Take 1 tablet (500 mg total) by mouth every 6 (six) hours as needed. 30 tablet 0  . EPINEPHrine 0.3 mg/0.3 mL IJ SOAJ injection Inject 0.3 mLs (0.3 mg total) into the muscle once. 1 Device 1  . labetalol (NORMODYNE) 300 MG tablet Take 1 tablet (300 mg total) by mouth 2 (two) times daily. (Patient taking differently: Take 300 mg by mouth daily. ) 60 tablet 6  . Multiple Vitamin (MULTIVITAMIN) tablet Take 1 tablet by mouth daily. GMC packet    . omega-3 acid ethyl esters (LOVAZA) 1 g capsule Take 1 g by mouth daily.    . Probiotic Product (PROBIOTIC PO) Take 1 capsule by mouth daily.    . sertraline (ZOLOFT) 100 MG tablet Take 1 tablet (100 mg total) by mouth daily. (Patient taking differently: Take 100 mg by mouth daily. ) 30 tablet 3   No facility-administered medications prior to visit.     Allergies  Allergen Reactions  . Hctz [Hydrochlorothiazide] Other (See Comments)    Malaise/HA/orthostatic sx's  . Ciprofloxacin Rash    Itchy/burning skin with MINIMAL rash per pt report  . Codeine  Rash  . Iron Rash  . Penicillins Rash    Has patient had a PCN reaction causing immediate rash, facial/tongue/throat swelling, SOB or lightheadedness with hypotension: No Has patient had a PCN reaction causing severe rash involving mucus membranes or skin necrosis: No Has patient had a PCN reaction that required hospitalization Yes Has patient had a PCN reaction occurring within the last 10 years: No If all of the above answers are "NO", then may proceed with Cephalosporin use.     . Sulfa Antibiotics Rash    ROS As per HPI  PE: Blood pressure 134/89, pulse 74, temperature 98.3 F (36.8 C), temperature source Temporal, resp. rate 15, weight 173 lb 1.9 oz (78.5 kg), last menstrual period 01/06/2016, SpO2  98 %, not currently breastfeeding. Gen: Alert, well appearing.  Patient is oriented to person, place, time, and situation. Face: no flush or rash currently.  No focal lesions.  Eyes and lips and tongue without swelling. No further exam today.  LABS:  none  IMPRESSION AND PLAN:  Recurrent facial flushing. I don't really know what this is, but it did not start happening until the emotional turmoil between her and her husband started to occur. She doesn't have any symptoms to suggest this is part of carcinoid syndrome or manifestation of a pheochromocytoma. It is unclear if the benadryl is making these go away --or are they spontaneously resolving? We talked at length today about things and I tried to reassure her that I don't think we need to do a big work-up at this time, but I think checking a CBC, CMET, and TSH is reasonable.   I also stressed the importance of keeping a diary of these episodes.  Instructions: Keep a diary of the events surrounding each episode of facial flushing  (note recent activities, recent food intake, recent emotional state, and any recent med use).  Bring this back with you in 2 weeks. We also decided to have her increase her sertraline to 100 mg qd today.  An After Visit Summary was printed and given to the patient.  Spent 30 min with pt today, with >50% of this time spent in counseling and care coordination regarding the above problems.  FOLLOW UP: Return in about 2 weeks (around 02/02/2016) for f/u facial flushing.  Signed:  Santiago Bumpers, MD           01/19/2016

## 2016-01-19 NOTE — Patient Instructions (Signed)
Keep a diary of the events surrounding each episode of facial flushing  (note recent activities, recent food intake, recent emotional state, and any recent med use).  Bring this back with you in 2 weeks.

## 2016-01-20 LAB — CBC WITH DIFFERENTIAL/PLATELET
BASOS ABS: 0 10*3/uL (ref 0.0–0.1)
Basophils Relative: 0.3 % (ref 0.0–3.0)
EOS ABS: 0.1 10*3/uL (ref 0.0–0.7)
Eosinophils Relative: 1.1 % (ref 0.0–5.0)
HEMATOCRIT: 39 % (ref 36.0–46.0)
Hemoglobin: 13.2 g/dL (ref 12.0–15.0)
LYMPHS PCT: 31.5 % (ref 12.0–46.0)
Lymphs Abs: 2.4 10*3/uL (ref 0.7–4.0)
MCHC: 33.9 g/dL (ref 30.0–36.0)
MCV: 89 fl (ref 78.0–100.0)
Monocytes Absolute: 0.3 10*3/uL (ref 0.1–1.0)
Monocytes Relative: 4.1 % (ref 3.0–12.0)
NEUTROS ABS: 4.9 10*3/uL (ref 1.4–7.7)
Neutrophils Relative %: 63 % (ref 43.0–77.0)
PLATELETS: 347 10*3/uL (ref 150.0–400.0)
RBC: 4.38 Mil/uL (ref 3.87–5.11)
RDW: 14.1 % (ref 11.5–15.5)
WBC: 7.7 10*3/uL (ref 4.0–10.5)

## 2016-01-20 LAB — COMPREHENSIVE METABOLIC PANEL
ALT: 13 U/L (ref 0–35)
AST: 17 U/L (ref 0–37)
Albumin: 4.6 g/dL (ref 3.5–5.2)
Alkaline Phosphatase: 53 U/L (ref 39–117)
BILIRUBIN TOTAL: 0.8 mg/dL (ref 0.2–1.2)
BUN: 10 mg/dL (ref 6–23)
CALCIUM: 10 mg/dL (ref 8.4–10.5)
CO2: 27 meq/L (ref 19–32)
CREATININE: 0.81 mg/dL (ref 0.40–1.20)
Chloride: 101 mEq/L (ref 96–112)
GFR: 82.89 mL/min (ref >60.00)
GLUCOSE: 58 mg/dL — AB (ref 70–99)
Potassium: 4.2 mEq/L (ref 3.5–5.1)
Sodium: 138 mEq/L (ref 135–145)
Total Protein: 7.4 g/dL (ref 6.0–8.3)

## 2016-01-24 LAB — TSH: TSH: 1.11 u[IU]/mL (ref 0.35–4.50)

## 2016-02-02 ENCOUNTER — Ambulatory Visit: Payer: 59 | Admitting: Family Medicine

## 2016-04-07 ENCOUNTER — Other Ambulatory Visit: Payer: Self-pay | Admitting: Family Medicine

## 2016-04-10 NOTE — Telephone Encounter (Signed)
RF request for labetalol LOV: 11/05/15 Next ov: None Last written: 04/08/15 #60 w/ 6RF

## 2016-05-31 ENCOUNTER — Other Ambulatory Visit: Payer: Self-pay | Admitting: Family Medicine

## 2016-05-31 NOTE — Telephone Encounter (Signed)
**  Remind patient they can make refill requests via MyChart**  Medication refill request (Name & Dosage): zoloft 100 mg tab     Preferred pharmacy (Name & Address): [facility] [id number] - [place], [place] - [address]    Other comments (if applicable): already contacted pharmacy

## 2016-06-01 NOTE — Telephone Encounter (Signed)
[facility] [address]  RF request for sertraline LOV: 11/05/15 Next ov: None Last written: 11/05/15 #30 w/ 3RF

## 2016-10-05 ENCOUNTER — Ambulatory Visit (INDEPENDENT_AMBULATORY_CARE_PROVIDER_SITE_OTHER): Payer: 59 | Admitting: Family Medicine

## 2016-10-05 ENCOUNTER — Ambulatory Visit: Payer: 59 | Admitting: Family Medicine

## 2016-10-05 ENCOUNTER — Encounter: Payer: Self-pay | Admitting: Family Medicine

## 2016-10-05 VITALS — BP 110/75 | HR 67 | Temp 98.0°F | Resp 16 | Ht 66.5 in | Wt 195.5 lb

## 2016-10-05 DIAGNOSIS — J069 Acute upper respiratory infection, unspecified: Secondary | ICD-10-CM | POA: Diagnosis not present

## 2016-10-05 DIAGNOSIS — J189 Pneumonia, unspecified organism: Secondary | ICD-10-CM

## 2016-10-05 MED ORDER — AZITHROMYCIN 250 MG PO TABS: ORAL_TABLET | ORAL | 0 refills | Status: DC

## 2016-10-05 MED ORDER — HYDROCODONE-HOMATROPINE 5-1.5 MG/5ML PO SYRP: ORAL_SOLUTION | ORAL | 0 refills | Status: DC

## 2016-10-05 NOTE — Progress Notes (Signed)
OFFICE VISIT  10/05/2016   CC:  Chief Complaint  Patient presents with  . URI    > 1 week   HPI:    Patient is a 41 y.o. Caucasian female who presents for respiratory complaints. Onset 7-8d ago, cough, ears hurt, nasal congestion and PND, HA.  ST initially but not now.  No face pain or upper teeth pain.  NO SOB or wheezing. Fatigue.  NO fever.  Appetite down a little.  Fluid intake is fine.   Tried tylenol/ibup, sudafed, dayquil.  Mucinex plain.  Saline nasal spray no help.  Past Medical History:  Diagnosis Date  . Anxiety    zoloft 25mg  helps a lot  . Complication of anesthesia    leg weakness for 1 year after epidural in 2001  . Depression    after pregnancy loss was on zoloft  . Hypertension    during pregnancy, + white coat (trial of enalapril  + ACE/HCTZ caused bp too low/sluggish.  Repeated home bp measurements normal.  . Lumbar spondylosis 07/27/11   with grade I spondylolisthesis (x-ray at chiropracter's office)  . Pregnancy-induced hypertension, history with 1st preg 05/09/2011; 2015/16    Past Surgical History:  Procedure Laterality Date  . BARTHOLIN GLAND CYST EXCISION     7-8 yrs ago  . CHOLECYSTECTOMY  07/07/2011   Procedure: LAPAROSCOPIC CHOLECYSTECTOMY;  Surgeon: Dalia Heading, MD;  Location: [facility];  Service: General;  Laterality: N/A;  . CYSTECTOMY  2007   vulvar cyst  . EYE SURGERY     lasik    Outpatient Medications Prior to Visit  Medication Sig Dispense Refill  . acetaminophen (TYLENOL) 500 MG tablet Take 1 tablet (500 mg total) by mouth every 6 (six) hours as needed. 30 tablet 0  . EPINEPHrine 0.3 mg/0.3 mL IJ SOAJ injection Inject 0.3 mLs (0.3 mg total) into the muscle once. 1 Device 1  . labetalol (NORMODYNE) 300 MG tablet TAKE 1 TABLET(300 MG) BY MOUTH TWICE DAILY 60 tablet 6  . Multiple Vitamin (MULTIVITAMIN) tablet Take 1 tablet by mouth daily. GMC packet    . omega-3 acid ethyl esters (LOVAZA) 1 g capsule Take 1 g by mouth daily.    .  Probiotic Product (PROBIOTIC PO) Take 1 capsule by mouth daily.    . sertraline (ZOLOFT) 100 MG tablet TAKE 1 TABLET BY MOUTH DAILY 30 tablet 3   No facility-administered medications prior to visit.     Allergies  Allergen Reactions  . Hctz [Hydrochlorothiazide] Other (See Comments)    Malaise/HA/orthostatic sx's  . Ciprofloxacin Rash    Itchy/burning skin with MINIMAL rash per pt report  . Codeine Rash  . Iron Rash  . Penicillins Rash    Has patient had a PCN reaction causing immediate rash, facial/tongue/throat swelling, SOB or lightheadedness with hypotension: No Has patient had a PCN reaction causing severe rash involving mucus membranes or skin necrosis: No Has patient had a PCN reaction that required hospitalization Yes Has patient had a PCN reaction occurring within the last 10 years: No If all of the above answers are "NO", then may proceed with Cephalosporin use.     . Sulfa Antibiotics Rash    ROS As per HPI  PE: Blood pressure 110/75, pulse 67, temperature 98 F (36.7 C), temperature source Oral, resp. rate 16, height 5' 6.5" (1.689 m), weight 195 lb 8 oz (88.7 kg), last menstrual period 09/24/2016, SpO2 98 %, not currently breastfeeding. VS: noted--normal. Gen: alert, NAD, NONTOXIC APPEARING. HEENT: eyes  without injection, drainage, or swelling.  Ears: EACs clear, TMs with normal light reflex and landmarks.  Nose: Clear rhinorrhea, with some dried, crusty exudate adherent to mildly injected mucosa.  No purulent d/c.  No paranasal sinus TTP.  No facial swelling.  Throat and mouth without focal lesion.  No pharyngial swelling, erythema, or exudate.   Neck: supple, no LAD.   LUNGS: CTA bilat but aeration is diffusely decreased on insp and exp--pt unable to take really deep breaths.  Nonlabored resps.   CV: RRR, no m/r/g. EXT: no c/c/e SKIN: no rash  LABS:  none  IMPRESSION AND PLAN:  URI, not improving at 7-8 day duration of sx's, with prominent cough and  feeling of SOB that could indicate atypical pneumonia.  Her oxygen sat is normal and RR normal, work of breathing normal. Will treat with Z-pack.   Also hycodan syrup 1 tsp qhs prn cough. Get otc generic robitussin DM and use as directed on the packaging for cough and congestion. Use otc generic saline nasal spray 2-3 times per day to irrigate/moisturize your nasal passages.  An After Visit Summary was printed and given to the patient.  FOLLOW UP: Return if symptoms worsen or fail to improve.  Signed:  Santiago BumpersPhil Jennfier Abdulla, MD           10/05/2016

## 2016-10-06 ENCOUNTER — Ambulatory Visit: Payer: 59 | Admitting: Family Medicine

## 2016-10-19 ENCOUNTER — Other Ambulatory Visit: Payer: Self-pay | Admitting: Family Medicine

## 2016-10-19 NOTE — Telephone Encounter (Signed)
[facility] [address]  RF request for sertraline LOV: 11/05/15 Next ov: None Last written: 06/02/15 #30 w/ 3Rf

## 2016-12-28 ENCOUNTER — Ambulatory Visit: Payer: 59

## 2016-12-29 ENCOUNTER — Ambulatory Visit (INDEPENDENT_AMBULATORY_CARE_PROVIDER_SITE_OTHER): Payer: 59

## 2016-12-29 DIAGNOSIS — Z23 Encounter for immunization: Secondary | ICD-10-CM | POA: Diagnosis not present

## 2017-01-01 HISTORY — PX: INTRAUTERINE DEVICE (IUD) INSERTION: SHX5877

## 2017-01-14 ENCOUNTER — Encounter: Payer: Self-pay | Admitting: Family Medicine

## 2017-01-21 ENCOUNTER — Other Ambulatory Visit: Payer: Self-pay | Admitting: Family Medicine

## 2017-01-23 NOTE — Telephone Encounter (Signed)
Patient over due for CPE.  Left detailed message on patient's cell stating she needs OV.  I sent in # 30, NO Refill.

## 2017-02-13 ENCOUNTER — Encounter: Payer: Self-pay | Admitting: Family Medicine

## 2017-02-13 ENCOUNTER — Other Ambulatory Visit: Payer: Self-pay

## 2017-02-13 ENCOUNTER — Ambulatory Visit (INDEPENDENT_AMBULATORY_CARE_PROVIDER_SITE_OTHER): Payer: 59 | Admitting: Family Medicine

## 2017-02-13 VITALS — BP 121/83 | HR 72 | Temp 98.2°F | Resp 16 | Ht 66.5 in | Wt 206.8 lb

## 2017-02-13 DIAGNOSIS — Z0000 Encounter for general adult medical examination without abnormal findings: Secondary | ICD-10-CM | POA: Diagnosis not present

## 2017-02-13 DIAGNOSIS — N3001 Acute cystitis with hematuria: Secondary | ICD-10-CM

## 2017-02-13 DIAGNOSIS — I10 Essential (primary) hypertension: Secondary | ICD-10-CM | POA: Diagnosis not present

## 2017-02-13 LAB — CBC WITH DIFFERENTIAL/PLATELET
BASOS PCT: 0.9 % (ref 0.0–3.0)
Basophils Absolute: 0.1 10*3/uL (ref 0.0–0.1)
EOS ABS: 0.3 10*3/uL (ref 0.0–0.7)
Eosinophils Relative: 3.4 % (ref 0.0–5.0)
HCT: 39.8 % (ref 36.0–46.0)
Hemoglobin: 13.6 g/dL (ref 12.0–15.0)
Lymphocytes Relative: 34.6 % (ref 12.0–46.0)
Lymphs Abs: 2.8 10*3/uL (ref 0.7–4.0)
MCHC: 34.1 g/dL (ref 30.0–36.0)
MCV: 89.5 fl (ref 78.0–100.0)
MONO ABS: 0.4 10*3/uL (ref 0.1–1.0)
Monocytes Relative: 5.4 % (ref 3.0–12.0)
NEUTROS ABS: 4.4 10*3/uL (ref 1.4–7.7)
NEUTROS PCT: 55.7 % (ref 43.0–77.0)
Platelets: 307 10*3/uL (ref 150.0–400.0)
RBC: 4.44 Mil/uL (ref 3.87–5.11)
RDW: 13.3 % (ref 11.5–15.5)
WBC: 8 10*3/uL (ref 4.0–10.5)

## 2017-02-13 LAB — COMPREHENSIVE METABOLIC PANEL
ALK PHOS: 68 U/L (ref 39–117)
ALT: 11 U/L (ref 0–35)
AST: 16 U/L (ref 0–37)
Albumin: 4.2 g/dL (ref 3.5–5.2)
BILIRUBIN TOTAL: 0.8 mg/dL (ref 0.2–1.2)
BUN: 12 mg/dL (ref 6–23)
CO2: 29 mEq/L (ref 19–32)
CREATININE: 0.71 mg/dL (ref 0.40–1.20)
Calcium: 9.7 mg/dL (ref 8.4–10.5)
Chloride: 103 mEq/L (ref 96–112)
GFR: 96 mL/min (ref >60.00)
GLUCOSE: 91 mg/dL (ref 70–99)
Potassium: 4 mEq/L (ref 3.5–5.1)
SODIUM: 138 meq/L (ref 135–145)
TOTAL PROTEIN: 6.8 g/dL (ref 6.0–8.3)

## 2017-02-13 LAB — LIPID PANEL
Cholesterol: 142 mg/dL (ref 0–200)
HDL: 41.3 mg/dL (ref >39.00)
LDL Cholesterol: 64 mg/dL (ref 0–99)
NONHDL: 100.43
Total CHOL/HDL Ratio: 3
Triglycerides: 180 mg/dL — ABNORMAL HIGH (ref 0.0–149.0)
VLDL: 36 mg/dL (ref 0.0–40.0)

## 2017-02-13 LAB — TSH: TSH: 3.47 u[IU]/mL (ref 0.35–4.50)

## 2017-02-13 LAB — POCT URINALYSIS DIPSTICK
BILIRUBIN UA: NEGATIVE
Glucose, UA: NEGATIVE
KETONES UA: NEGATIVE
Nitrite, UA: NEGATIVE
PH UA: 7 (ref 5.0–8.0)
Protein, UA: NEGATIVE
Spec Grav, UA: 1.02 (ref 1.010–1.025)
Urobilinogen, UA: 0.2 E.U./dL

## 2017-02-13 NOTE — Progress Notes (Signed)
Office Note 02/13/2017  CC:  Chief Complaint  Patient presents with  . Annual Exam    Pt is fasting.     HPI:  Mary Gross is a 41 y.o. White female who is here for annual health maintenance exam.  Occ bp monitoring at pharmacy show bp normal consistently.   Exercise: none, but she is active all day with her kids. Diet: no formal dieting, just tries to be conscious of healthy eating.  Pt states she has had no urinary complaints, but a "routine" urine check at her GYN recently showed signs of infection so she was treated with abx.  She asks for UA to check to see if abnormalities are gone.  She does state she just started her menses in the last 1-2 days.  Past Medical History:  Diagnosis Date  . Anxiety    zoloft 25mg  helps a lot  . Complication of anesthesia    leg weakness for 1 year after epidural in 2001  . Depression    after pregnancy loss was on zoloft  . Hypertension    during pregnancy, + white coat (trial of enalapril  + ACE/HCTZ caused bp too low/sluggish.  Repeated home bp measurements normal.  . Lumbar spondylosis 07/27/11   with grade I spondylolisthesis (x-ray at chiropracter's office)  . Pregnancy-induced hypertension, history with 1st preg 05/09/2011; 2015/16    Past Surgical History:  Procedure Laterality Date  . BARTHOLIN GLAND CYST EXCISION     7-8 yrs ago  . CYSTECTOMY  2007   vulvar cyst  . EYE SURGERY     lasik  . INTRAUTERINE DEVICE (IUD) INSERTION  01/01/2017   Mirena  . LAPAROSCOPIC CHOLECYSTECTOMY N/A 07/07/2011   Performed by Dalia Heading, MD at [facility]    Family History  Problem Relation Age of Onset  . Peripheral vascular disease Mother   . Depression Mother   . Hypertension Father   . Kidney disease Father        Bright's Disease as a child  . Hypertension Paternal Aunt   . Thyroid disease Maternal Grandmother   . Cancer Maternal Grandmother        bladder   . Stroke Paternal Grandmother   . Parkinsonism Paternal  Grandmother   . Stroke Paternal Grandfather   . Hypertension Maternal Uncle   . Anesthesia problems Neg Hx   . Hypotension Neg Hx   . Malignant hyperthermia Neg Hx   . Pseudochol deficiency Neg Hx     Social History   Socioeconomic History  . Marital status: Married    Spouse name: Not on file  . Number of children: Not on file  . Years of education: Not on file  . Highest education level: Not on file  Social Needs  . Financial resource strain: Not on file  . Food insecurity - worry: Not on file  . Food insecurity - inability: Not on file  . Transportation needs - medical: Not on file  . Transportation needs - non-medical: Not on file  Occupational History  . Not on file  Tobacco Use  . Smoking status: Never Smoker  . Smokeless tobacco: Never Used  Substance and Sexual Activity  . Alcohol use: No  . Drug use: No  . Sexual activity: Yes    Partners: Male    Birth control/protection: None  Other Topics Concern  . Not on file  Social History Narrative   Married, 4 children --2 sons and 2 daughters.   Home-schools  her children.   Stay at home mom.   Husband works in Consulting civil engineerT at [employer] [place].   No T/A/Ds.   Exercise--"chasing kids around".  Normal american diet.          Outpatient Medications Prior to Visit  Medication Sig Dispense Refill  . acetaminophen (TYLENOL) 500 MG tablet Take 1 tablet (500 mg total) by mouth every 6 (six) hours as needed. 30 tablet 0  . APPLE CIDER VINEGAR PO Take 2 capsules daily by mouth.     . CRANBERRY PO Take 2 capsules daily by mouth.    . EPINEPHrine 0.3 mg/0.3 mL IJ SOAJ injection Inject 0.3 mLs (0.3 mg total) into the muscle once. 1 Device 1  . labetalol (NORMODYNE) 300 MG tablet TAKE 1 TABLET(300 MG) BY MOUTH TWICE DAILY (Patient taking differently: taking 1 tablet daily) 60 tablet 6  . Multiple Vitamin (MULTIVITAMIN) tablet Take 2 tablets daily by mouth.     . omega-3 acid ethyl esters (LOVAZA) 1 g capsule Take 1 g by  mouth daily.    . Probiotic Product (PROBIOTIC PO) Take 1 capsule by mouth daily.    . sertraline (ZOLOFT) 100 MG tablet TAKE 1 TABLET BY MOUTH DAILY 30 tablet 0  . azithromycin (ZITHROMAX) 250 MG tablet 2 tabs po qd x 1d, then 1 tab po qd x 4d (Patient not taking: Reported on 02/13/2017) 6 tablet 0  . HYDROcodone-homatropine (HYCODAN) 5-1.5 MG/5ML syrup 1 tsp po qhs prn cough (Patient not taking: Reported on 02/13/2017) 120 mL 0   No facility-administered medications prior to visit.     Allergies  Allergen Reactions  . Hctz [Hydrochlorothiazide] Other (See Comments)    Malaise/HA/orthostatic sx's  . Ciprofloxacin Rash    Itchy/burning skin with MINIMAL rash per pt report  . Codeine Rash  . Iron Rash  . Penicillins Rash    Has patient had a PCN reaction causing immediate rash, facial/tongue/throat swelling, SOB or lightheadedness with hypotension: No Has patient had a PCN reaction causing severe rash involving mucus membranes or skin necrosis: No Has patient had a PCN reaction that required hospitalization Yes Has patient had a PCN reaction occurring within the last 10 years: No If all of the above answers are "NO", then may proceed with Cephalosporin use.     . Sulfa Antibiotics Rash    ROS Review of Systems  Constitutional: Negative for appetite change, chills, fatigue and fever.  HENT: Negative for congestion, dental problem, ear pain and sore throat.   Eyes: Negative for discharge, redness and visual disturbance.  Respiratory: Negative for cough, chest tightness, shortness of breath and wheezing.   Cardiovascular: Negative for chest pain, palpitations and leg swelling.  Gastrointestinal: Negative for abdominal pain, blood in stool, diarrhea, nausea and vomiting.  Genitourinary: Negative for difficulty urinating, dysuria, flank pain, frequency, hematuria and urgency.  Musculoskeletal: Negative for arthralgias, back pain, joint swelling, myalgias and neck stiffness.  Skin:  Negative for pallor and rash.  Neurological: Negative for dizziness, speech difficulty, weakness and headaches.  Hematological: Negative for adenopathy. Does not bruise/bleed easily.  Psychiatric/Behavioral: Negative for confusion and sleep disturbance. The patient is not nervous/anxious.     PE; Blood pressure 121/83, pulse 72, temperature 98.2 F (36.8 C), temperature source Oral, resp. rate 16, height 5' 6.5" (1.689 m), weight 206 lb 12 oz (93.8 kg), last menstrual period 02/12/2017, SpO2 98 %, not currently breastfeeding.  Pt examined with Faythe GheeLisa Albright, CMA, as chaperone.   Gen: Alert, well appearing.  Patient is oriented to person, place, time, and situation. AFFECT: pleasant, lucid thought and speech. ENT: Ears: EACs clear, normal epithelium.  TMs with good light reflex and landmarks bilaterally.  Eyes: no injection, icteris, swelling, or exudate.  EOMI, PERRLA. Nose: no drainage or turbinate edema/swelling.  No injection or focal lesion.  Mouth: lips without lesion/swelling.  Oral mucosa pink and moist.  Dentition intact and without obvious caries or gingival swelling.  Oropharynx without erythema, exudate, or swelling.  Neck: supple/nontender.  No LAD, mass, or TM.  Carotid pulses 2+ bilaterally, without bruits. CV: RRR, no m/r/g.   LUNGS: CTA bilat, nonlabored resps, good aeration in all lung fields. ABD: soft, NT, ND, BS normal.  No hepatospenomegaly or mass.  No bruits. EXT: no clubbing, cyanosis, or edema.  Musculoskeletal: no joint swelling, erythema, warmth, or tenderness.  ROM of all joints intact. Skin - no sores or suspicious lesions or rashes or color changes   Pertinent labs:  Lab Results  Component Value Date   TSH 1.11 01/19/2016   Lab Results  Component Value Date   WBC 7.7 01/19/2016   HGB 13.2 01/19/2016   HCT 39.0 01/19/2016   MCV 89.0 01/19/2016   PLT 347.0 01/19/2016   Lab Results  Component Value Date   CREATININE 0.81 01/19/2016   BUN 10  01/19/2016   NA 138 01/19/2016   K 4.2 01/19/2016   CL 101 01/19/2016   CO2 27 01/19/2016   Lab Results  Component Value Date   ALT 13 01/19/2016   AST 17 01/19/2016   ALKPHOS 53 01/19/2016   BILITOT 0.8 01/19/2016   Lab Results  Component Value Date   CHOL 164 01/21/2014   Lab Results  Component Value Date   HDL 48.70 01/21/2014   Lab Results  Component Value Date   LDLCALC 95 01/21/2014   Lab Results  Component Value Date   TRIG 103.0 01/21/2014   Lab Results  Component Value Date   CHOLHDL 3 01/21/2014   UA dipstick today: trace leu, mod blood   ASSESSMENT AND PLAN:   Health maintenance exam: Reviewed age and gender appropriate health maintenance issues (prudent diet, regular exercise, health risks of tobacco and excessive alcohol, use of seatbelts, fire alarms in home, use of sunscreen).  Also reviewed age and gender appropriate health screening as well as vaccine recommendations. Vaccines:all UTD, including flu. LABS: fasting HP. Cerv ca screening: gets this through her GYN--most recent was 10/2015. Breast ca screening: gets mammo's through her GYN---most recent 11/2016.  Recent UTI by UA check alone (asymptomatic): she finished a course of abx from her GYN MD for this. UA today: trace LEU, mod blood--suspect no infection--changes likely due to contaminant with menses blood. Send urine for c/s today, start abx only if >100K of one organism grows.  An After Visit Summary was printed and given to the patient.  FOLLOW UP:  Return in about 6 months (around 08/13/2017) for routine chronic illness f/u.  Signed:  Santiago BumpersPhil Seleen Walter, MD           02/13/2017

## 2017-02-13 NOTE — Patient Instructions (Signed)

## 2017-02-13 NOTE — Addendum Note (Signed)
Addended by: Eulah PontALBRIGHT, LISA M on: 02/13/2017 10:42 AM   Modules accepted: Orders

## 2017-02-14 LAB — URINE CULTURE
MICRO NUMBER:: 81311200
SPECIMEN QUALITY: ADEQUATE

## 2017-02-22 ENCOUNTER — Other Ambulatory Visit: Payer: Self-pay | Admitting: Family Medicine

## 2017-04-18 ENCOUNTER — Other Ambulatory Visit: Payer: Self-pay | Admitting: Family Medicine

## 2017-07-11 ENCOUNTER — Ambulatory Visit: Payer: 59 | Admitting: Family Medicine

## 2017-07-11 ENCOUNTER — Encounter: Payer: Self-pay | Admitting: Family Medicine

## 2017-07-11 VITALS — BP 122/88 | HR 77 | Temp 98.5°F | Resp 16 | Ht 66.5 in | Wt 202.1 lb

## 2017-07-11 DIAGNOSIS — M722 Plantar fascial fibromatosis: Secondary | ICD-10-CM | POA: Diagnosis not present

## 2017-07-11 DIAGNOSIS — M79671 Pain in right foot: Secondary | ICD-10-CM

## 2017-07-11 NOTE — Progress Notes (Signed)
OFFICE VISIT  07/11/2017   CC:  Chief Complaint  Patient presents with  . Foot Pain    Right x years   HPI:    Patient is a 42 y.o. Caucasian female who presents accompanied by her mother for right heel pain.  For years, off and on pain in right heel, now constant when wt bearing for the last several months.  Hurts the worst upon getting out of bed in morning. No hx of injury.  No swelling or redness in ankle or foot. Never had this evaluated before. Has worn heel lift in shoe, also tried custum shoe inserts for arches---no help.  Past Medical History:  Diagnosis Date  . Anxiety    zoloft 25mg  helps a lot  . Complication of anesthesia    leg weakness for 1 year after epidural in 2001  . Depression    after pregnancy loss was on zoloft  . Hypertension    during pregnancy, + white coat (trial of enalapril  + ACE/HCTZ caused bp too low/sluggish.  Repeated home bp measurements normal.  . Lumbar spondylosis 07/27/11   with grade I spondylolisthesis (x-ray at chiropracter's office)  . Pregnancy-induced hypertension, history with 1st preg 05/09/2011; 2015/16    Past Surgical History:  Procedure Laterality Date  . BARTHOLIN GLAND CYST EXCISION     7-8 yrs ago  . CHOLECYSTECTOMY  07/07/2011   Procedure: LAPAROSCOPIC CHOLECYSTECTOMY;  Surgeon: Dalia HeadingMark A Jenkins, MD;  Location: [facility];  Service: General;  Laterality: N/A;  . CYSTECTOMY  2007   vulvar cyst  . EYE SURGERY     lasik  . INTRAUTERINE DEVICE (IUD) INSERTION  01/01/2017   Mirena    Outpatient Medications Prior to Visit  Medication Sig Dispense Refill  . CRANBERRY PO Take 2 capsules daily by mouth.    . EPINEPHrine 0.3 mg/0.3 mL IJ SOAJ injection Inject 0.3 mLs (0.3 mg total) into the muscle once. 1 Device 1  . labetalol (NORMODYNE) 300 MG tablet TAKE 1 TABLET(300 MG) BY MOUTH TWICE DAILY (Patient taking differently: take 1 tablet daily) 60 tablet 5  . levonorgestrel (MIRENA, 52 MG,) 20 MCG/24HR IUD Mirena 20 mcg/24 hours  (5 yrs) 52 mg intrauterine device  Take 1 device by intrauterine route.    . Multiple Vitamin (MULTIVITAMIN) tablet Take 2 tablets daily by mouth.     . omega-3 acid ethyl esters (LOVAZA) 1 g capsule Take 1 g by mouth daily.    . Probiotic Product (PROBIOTIC PO) Take 1 capsule by mouth daily.    . sertraline (ZOLOFT) 100 MG tablet TAKE 1 TABLET BY MOUTH DAILY 90 tablet 1  . acetaminophen (TYLENOL) 500 MG tablet Take 1 tablet (500 mg total) by mouth every 6 (six) hours as needed. (Patient not taking: Reported on 07/11/2017) 30 tablet 0  . APPLE CIDER VINEGAR PO Take 2 capsules daily by mouth.      No facility-administered medications prior to visit.     Allergies  Allergen Reactions  . Hctz [Hydrochlorothiazide] Other (See Comments)    Malaise/HA/orthostatic sx's  . Ciprofloxacin Rash    Itchy/burning skin with MINIMAL rash per pt report  . Codeine Rash  . Iron Rash  . Penicillins Rash    Has patient had a PCN reaction causing immediate rash, facial/tongue/throat swelling, SOB or lightheadedness with hypotension: No Has patient had a PCN reaction causing severe rash involving mucus membranes or skin necrosis: No Has patient had a PCN reaction that required hospitalization Yes Has patient had  a PCN reaction occurring within the last 10 years: No If all of the above answers are "NO", then may proceed with Cephalosporin use.     . Sulfa Antibiotics Rash    ROS As per HPI  PE: Blood pressure 122/88, pulse 77, temperature 98.5 F (36.9 C), temperature source Oral, resp. rate 16, height 5' 6.5" (1.689 m), weight 202 lb 2 oz (91.7 kg), SpO2 99 %. Gen: Alert, well appearing.  Patient is oriented to person, place, time, and situation. AFFECT: pleasant, lucid thought and speech. Right foot: no swelling or erythema.  DP and PT pulses 2+. No tenderness over achilles tendon or back of heel.  Essentially no tenderness over surface of heel except possibly a bit of discomfort with deep  palpation over medial calcaneal tubercle.  No arch or metatarsal head tenderness.  LABS:    Chemistry      Component Value Date/Time   NA 138 02/13/2017 0925   K 4.0 02/13/2017 0925   CL 103 02/13/2017 0925   CO2 29 02/13/2017 0925   BUN 12 02/13/2017 0925   CREATININE 0.71 02/13/2017 0925      Component Value Date/Time   CALCIUM 9.7 02/13/2017 0925   ALKPHOS 68 02/13/2017 0925   AST 16 02/13/2017 0925   ALT 11 02/13/2017 0925   BILITOT 0.8 02/13/2017 0925      IMPRESSION AND PLAN:  Right heel pain; exclusively with wt bearing. On and off for years---more consistent lately. Failed 1/4 inch heel lift. Recommended aleve 440 bid x 10d. Also recommended she see a sports med MD for further eval and consideration of injection if they agree with dx. Ordered this referral today and ordered 2 view R foot x-ray today.  An After Visit Summary was printed and given to the patient.  FOLLOW UP: Return for as needed.  Signed:  Santiago Bumpers, MD           07/11/2017

## 2017-07-11 NOTE — Patient Instructions (Signed)
Take 2 aleve tabs twice a day with food for 10 days.  I have referred you to a sports medicine specialist for further evaluation and treatment.  I ordered a foot x-ray at [facility]--you don't have to schedule an appt--just go at your convenience.

## 2017-07-12 ENCOUNTER — Ambulatory Visit (HOSPITAL_COMMUNITY)
Admission: RE | Admit: 2017-07-12 | Discharge: 2017-07-12 | Disposition: A | Discharge status: Home / Self Care | Payer: 59 | Source: Ambulatory Visit | Attending: Family Medicine | Admitting: Family Medicine

## 2017-07-12 DIAGNOSIS — M7731 Calcaneal spur, right foot: Secondary | ICD-10-CM | POA: Diagnosis not present

## 2017-07-12 DIAGNOSIS — M79671 Pain in right foot: Secondary | ICD-10-CM

## 2017-07-23 ENCOUNTER — Ambulatory Visit: Payer: 59 | Admitting: Sports Medicine

## 2017-07-23 ENCOUNTER — Encounter: Payer: Self-pay | Admitting: Sports Medicine

## 2017-07-23 ENCOUNTER — Ambulatory Visit: Payer: Self-pay

## 2017-07-23 VITALS — BP 114/86 | HR 84 | Ht 66.5 in | Wt 207.0 lb

## 2017-07-23 DIAGNOSIS — M722 Plantar fascial fibromatosis: Secondary | ICD-10-CM | POA: Diagnosis not present

## 2017-07-23 DIAGNOSIS — M79671 Pain in right foot: Secondary | ICD-10-CM

## 2017-07-23 DIAGNOSIS — G8929 Other chronic pain: Secondary | ICD-10-CM

## 2017-07-23 NOTE — Procedures (Signed)
LIMITED MSK ULTRASOUND OF Right foot Images were obtained an d interpreted by myself, Gaspar Bidding, DO  Images have been saved and stored to PACS system. Images obtained on: GE S7 Ultrasound machine  FINDINGS:   Slightly enlarged longitudinal arch on the right with enlargement of the plantar fascia origin at 0.54 cm  IMPRESSION:  1. Right plantar fasciitis

## 2017-07-23 NOTE — Patient Instructions (Signed)
Please perform the exercise program that we have prepared for you and gone over in detail on a daily basis.  In addition to the handout you were provided you can access your program through: [email or web address]   Your unique program code is: [facility]

## 2017-07-23 NOTE — Progress Notes (Signed)
Mary Gross. [facility] at [facility] [phone]  Mary Gross - 42 y.o. female MRN [id number]  Date of birth: Jul 13, 1975  Visit Date: 07/23/2017  PCP: Jeoffrey Massed, MD   Referred by: Jeoffrey Massed, MD  Scribe for today's visit: Christoper Fabian, LAT, ATC     SUBJECTIVE:  Mary Gross is here for New Patient (Initial Visit) (R heel pain) .  Referred by: Dr. Milinda Cave Her R medial heel  pain symptoms INITIALLY: Began several years ago and has been intermittent but worsening over the past few months. Described as mild-mod, sharp pain that is constant w/ weight bearing, nonradiating Worsened with weight bearing Improved with rest, Aleve Additional associated symptoms include: no N/T noted in R foot    At this time symptoms are worsening compared to onset w/ increased pain and increased frequency of pain. She has Dr. Margart Sickles insoles and uses a heel lift.  Had XR of her R foot on 07/11/17.    ROS Denies night time disturbances. Denies fevers, chills, or night sweats. Denies unexplained weight loss. Denies personal history of cancer. Denies changes in bowel or bladder habits. Denies recent unreported falls. Denies new or worsening dyspnea or wheezing. Denies headaches or dizziness.  Denies numbness, tingling or weakness  In the extremities.  Denies dizziness or presyncopal episodes Denies lower extremity edema    HISTORY & PERTINENT PRIOR DATA:  Prior History reviewed and updated per electronic medical record.  Significant/pertinent history, findings, studies include:  reports that she has never smoked. She has never used smokeless tobacco. No results for input(s): HGBA1C, LABURIC, CREATINE in the last 8760 hours. No specialty comments available. Problem  Plantar Fasciitis, Right    OBJECTIVE:  VS:  HT:5' 6.5" (168.9 cm)   WT:207 lb (93.9 kg)  BMI:32.91    BP:114/86  HR:84bpm  TEMP: ( )  RESP:98 %   PHYSICAL  EXAM: Constitutional: WDWN, Non-toxic appearing. Psychiatric: Alert & appropriately interactive.  Not depressed or anxious appearing. Respiratory: No increased work of breathing.  Trachea Midline Eyes: Pupils are equal.  EOM intact without nystagmus.  No scleral icterus  Vascular Exam: warm to touch no edema DP & PT Pulses: normal and present  lower extremity neuro exam: unremarkable normal strength normal sensation  MSK Exam: Right foot is overall overall well aligned without significant deformity.  She has a moderately high cavus foot.  Good dorsiflexion to 100 degrees.  Marked TTP over the origin of the plantar fascia.  Mild pain over the longitudinal arch.  Ankle drawer testing and ligamentous testing is stable.   ASSESSMENT & PLAN:   1. Chronic heel pain, right   2. Plantar fasciitis, right     PLAN: Fairly classic presentation of plantar fasciitis.    Alfredson exercises with additional home exercises reviewed per procedure note Longitudinal arch support recommended begin with over-the-counter options Frequent icing Appropriate activity modifications excellent 6-week follow-up for clinical reevaluation and consideration of further advanced imaging.   PROCEDURE NOTE: THERAPEUTIC EXERCISES (97110) 15 minutes spent for Therapeutic exercises as below and as referenced in the AVS.  This included exercises focusing on stretching, strengthening, with significant focus on eccentric aspects.   Proper technique shown and discussed handout in great detail with ATC.  All questions were discussed and answered.   Long term goals include an improvement in range of motion, strength, endurance as well as avoiding reinjury. Frequency of visits is one time as determined during  today's  office visit. Frequency of exercises to be performed is as per handout.  EXERCISES REVIEWED:  Alfredson Protocol  Plantar Fascia Stretch  Follow-up: Return in about 6 weeks (around 09/03/2017).         Please see additional documentation for Objective, Assessment and Plan sections. Pertinent additional documentation may be included in corresponding procedure notes, imaging studies, problem based documentation and patient instructions. Please see these sections of the encounter for additional information regarding this visit.  CMA/ATC served as Neurosurgeon during this visit. History, Physical, and Plan performed by medical provider. Documentation and orders reviewed and attested to.      Andrena Mews, DO    [facility]

## 2017-07-25 DIAGNOSIS — M722 Plantar fascial fibromatosis: Secondary | ICD-10-CM

## 2017-07-25 HISTORY — DX: Plantar fascial fibromatosis: M72.2

## 2017-08-08 ENCOUNTER — Ambulatory Visit: Payer: 59 | Admitting: Family Medicine

## 2017-08-14 ENCOUNTER — Encounter: Payer: Self-pay | Admitting: Family Medicine

## 2017-08-15 ENCOUNTER — Ambulatory Visit: Payer: 59 | Admitting: Family Medicine

## 2017-09-03 ENCOUNTER — Ambulatory Visit: Payer: 59 | Admitting: Sports Medicine

## 2017-10-20 ENCOUNTER — Other Ambulatory Visit: Payer: Self-pay | Admitting: Family Medicine

## 2018-01-17 ENCOUNTER — Ambulatory Visit: Payer: 59

## 2018-01-24 ENCOUNTER — Ambulatory Visit (INDEPENDENT_AMBULATORY_CARE_PROVIDER_SITE_OTHER): Payer: BLUE CROSS/BLUE SHIELD

## 2018-01-24 DIAGNOSIS — Z23 Encounter for immunization: Secondary | ICD-10-CM | POA: Diagnosis not present

## 2018-01-25 ENCOUNTER — Other Ambulatory Visit: Payer: Self-pay | Admitting: Family Medicine

## 2018-02-24 ENCOUNTER — Other Ambulatory Visit: Payer: Self-pay | Admitting: Family Medicine

## 2018-02-25 NOTE — Telephone Encounter (Signed)
Pt is over due for f/u CPE needs office visit for more refills.   I already sent in #30 w/ 0RF and pt was advised then to f/u.  Will send Rx for #14 w/ 0RF.  Pt advised and voiced understanding.    Apt for CPE made for 03/04/18 at 10:15am.

## 2018-03-01 ENCOUNTER — Encounter: Payer: Self-pay | Admitting: *Deleted

## 2018-03-01 DIAGNOSIS — F32A Depression, unspecified: Secondary | ICD-10-CM | POA: Insufficient documentation

## 2018-03-01 DIAGNOSIS — F329 Major depressive disorder, single episode, unspecified: Secondary | ICD-10-CM | POA: Insufficient documentation

## 2018-03-04 ENCOUNTER — Encounter: Payer: Self-pay | Admitting: Family Medicine

## 2018-03-04 ENCOUNTER — Ambulatory Visit (INDEPENDENT_AMBULATORY_CARE_PROVIDER_SITE_OTHER): Payer: BLUE CROSS/BLUE SHIELD | Admitting: Family Medicine

## 2018-03-04 VITALS — BP 133/90 | HR 81 | Temp 98.0°F | Resp 16 | Ht 66.25 in | Wt 215.0 lb

## 2018-03-04 DIAGNOSIS — Z0000 Encounter for general adult medical examination without abnormal findings: Secondary | ICD-10-CM

## 2018-03-04 DIAGNOSIS — E669 Obesity, unspecified: Secondary | ICD-10-CM | POA: Diagnosis not present

## 2018-03-04 DIAGNOSIS — I10 Essential (primary) hypertension: Secondary | ICD-10-CM

## 2018-03-04 LAB — CBC WITH DIFFERENTIAL/PLATELET
BASOS PCT: 1.2 % (ref 0.0–3.0)
Basophils Absolute: 0.1 10*3/uL (ref 0.0–0.1)
EOS PCT: 3.2 % (ref 0.0–5.0)
Eosinophils Absolute: 0.3 10*3/uL (ref 0.0–0.7)
HCT: 39.9 % (ref 36.0–46.0)
Hemoglobin: 13.6 g/dL (ref 12.0–15.0)
LYMPHS ABS: 2.2 10*3/uL (ref 0.7–4.0)
Lymphocytes Relative: 25.9 % (ref 12.0–46.0)
MCHC: 34.1 g/dL (ref 30.0–36.0)
MCV: 89.9 fl (ref 78.0–100.0)
MONO ABS: 0.4 10*3/uL (ref 0.1–1.0)
Monocytes Relative: 4.6 % (ref 3.0–12.0)
NEUTROS ABS: 5.4 10*3/uL (ref 1.4–7.7)
Neutrophils Relative %: 65.1 % (ref 43.0–77.0)
PLATELETS: 287 10*3/uL (ref 150.0–400.0)
RBC: 4.44 Mil/uL (ref 3.87–5.11)
RDW: 13.2 % (ref 11.5–15.5)
WBC: 8.4 10*3/uL (ref 4.0–10.5)

## 2018-03-04 LAB — TSH: TSH: 1.49 u[IU]/mL (ref 0.35–4.50)

## 2018-03-04 LAB — LIPID PANEL
Cholesterol: 153 mg/dL (ref 0–200)
HDL: 43.7 mg/dL (ref >39.00)
LDL CALC: 74 mg/dL (ref 0–99)
NonHDL: 109.21
Total CHOL/HDL Ratio: 3
Triglycerides: 175 mg/dL — ABNORMAL HIGH (ref 0.0–149.0)
VLDL: 35 mg/dL (ref 0.0–40.0)

## 2018-03-04 LAB — COMPREHENSIVE METABOLIC PANEL
ALT: 13 U/L (ref 0–35)
AST: 17 U/L (ref 0–37)
Albumin: 4.4 g/dL (ref 3.5–5.2)
Alkaline Phosphatase: 72 U/L (ref 39–117)
BUN: 14 mg/dL (ref 6–23)
CALCIUM: 9.5 mg/dL (ref 8.4–10.5)
CHLORIDE: 103 meq/L (ref 96–112)
CO2: 29 meq/L (ref 19–32)
CREATININE: 0.78 mg/dL (ref 0.40–1.20)
GFR: 85.69 mL/min (ref >60.00)
Glucose, Bld: 88 mg/dL (ref 70–99)
Potassium: 4 mEq/L (ref 3.5–5.1)
Sodium: 139 mEq/L (ref 135–145)
Total Bilirubin: 0.7 mg/dL (ref 0.2–1.2)
Total Protein: 7.1 g/dL (ref 6.0–8.3)

## 2018-03-04 MED ORDER — SERTRALINE HCL 100 MG PO TABS: 100.0000 mg | ORAL_TABLET | Freq: Every day | ORAL | 1 refills | Status: DC

## 2018-03-04 MED ORDER — LABETALOL HCL 300 MG PO TABS: ORAL_TABLET | ORAL | 5 refills | Status: DC

## 2018-03-04 NOTE — Progress Notes (Signed)
Office Note 03/04/2018  CC:  Chief Complaint  Patient presents with  . Annual Exam    pt is fasting    HPI:  Mary Gross is a 42 y.o. White female who is here for annual health maintenance exam. Doing well, no acute complaints.  HTN: no home bp monitoring. Takes labetalol only qd, but none yet today. Eyes: lasik was done and all is good. Dental all UTD.  Past Medical History:  Diagnosis Date  . Anxiety    zoloft 25mg  helps a lot  . Complication of anesthesia    leg weakness for 1 year after epidural in 2001  . Depression    after pregnancy loss was on zoloft  . Hypertension    during pregnancy, + white coat (trial of enalapril  + ACE/HCTZ caused bp too low/sluggish.  Repeated home bp measurements normal.  . Lumbar spondylosis 07/27/11   with grade I spondylolisthesis (x-ray at chiropracter's office)  . Plantar fasciitis of right foot 07/2017   Dr. Berline Chough  . Pregnancy-induced hypertension, history with 1st preg 05/09/2011; 2015/16    Past Surgical History:  Procedure Laterality Date  . BARTHOLIN GLAND CYST EXCISION     7-8 yrs ago  . CHOLECYSTECTOMY  07/07/2011   Procedure: LAPAROSCOPIC CHOLECYSTECTOMY;  Surgeon: Dalia Heading, MD;  Location: [facility];  Service: General;  Laterality: N/A;  . CYSTECTOMY  2007   vulvar cyst  . EYE SURGERY     lasik  . INTRAUTERINE DEVICE (IUD) INSERTION  01/01/2017   Mirena    Family History  Problem Relation Age of Onset  . Peripheral vascular disease Mother   . Depression Mother   . Hypertension Father   . Kidney disease Father        Bright's Disease as a child  . Hypertension Paternal Aunt   . Thyroid disease Maternal Grandmother   . Cancer Maternal Grandmother        bladder   . Stroke Paternal Grandmother   . Parkinsonism Paternal Grandmother   . Stroke Paternal Grandfather   . Hypertension Maternal Uncle   . Anesthesia problems Neg Hx   . Hypotension Neg Hx   . Malignant hyperthermia Neg Hx   . Pseudochol  deficiency Neg Hx     Social History   Socioeconomic History  . Marital status: Married    Spouse name: Not on file  . Number of children: Not on file  . Years of education: Not on file  . Highest education level: Not on file  Occupational History  . Not on file  Social Needs  . Financial resource strain: Not on file  . Food insecurity:    Worry: Not on file    Inability: Not on file  . Transportation needs:    Medical: Not on file    Non-medical: Not on file  Tobacco Use  . Smoking status: Never Smoker  . Smokeless tobacco: Never Used  Substance and Sexual Activity  . Alcohol use: No  . Drug use: No  . Sexual activity: Yes    Partners: Male    Birth control/protection: None  Lifestyle  . Physical activity:    Days per week: Not on file    Minutes per session: Not on file  . Stress: Not on file  Relationships  . Social connections:    Talks on phone: Not on file    Gets together: Not on file    Attends religious service: Not on file    Active  member of club or organization: Not on file    Attends meetings of clubs or organizations: Not on file    Relationship status: Not on file  . Intimate partner violence:    Fear of current or ex partner: Not on file    Emotionally abused: Not on file    Physically abused: Not on file    Forced sexual activity: Not on file  Other Topics Concern  . Not on file  Social History Narrative   Separated/divorced 2018, 4 children --2 sons and 2 daughters.   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     Stay at home mom.   Ex-husband works in Consulting civil engineerT at [employer] in [place].   No T/A/Ds.   Exercise--"chasing kids around".  Normal american diet.          Outpatient Medications Prior to Visit  Medication Sig Dispense Refill  . CRANBERRY PO Take 2 capsules daily by mouth.    . levonorgestrel (MIRENA, 52 MG,) 20 MCG/24HR IUD Mirena 20 mcg/24 hours (5 yrs) 52 mg intrauterine device  Take 1 device by intrauterine route.    . Multiple Vitamin  (MULTIVITAMIN) tablet Take 2 tablets daily by mouth.     . omega-3 acid ethyl esters (LOVAZA) 1 g capsule Take 1 g by mouth daily.    . Probiotic Product (PROBIOTIC PO) Take 1 capsule by mouth daily.    Marland Kitchen. labetalol (NORMODYNE) 300 MG tablet TAKE 1 TABLET(300 MG) BY MOUTH TWICE DAILY (Patient taking differently: take 1 tablet daily) 60 tablet 5  . sertraline (ZOLOFT) 100 MG tablet TAKE 1 TABLET BY MOUTH DAILY 14 tablet 0   No facility-administered medications prior to visit.     Allergies  Allergen Reactions  . Hctz [Hydrochlorothiazide] Other (See Comments)    Malaise/HA/orthostatic sx's  . Ciprofloxacin Rash    Itchy/burning skin with MINIMAL rash per pt report  . Codeine Rash  . Iron Rash  . Penicillins Rash    Has patient had a PCN reaction causing immediate rash, facial/tongue/throat swelling, SOB or lightheadedness with hypotension: No Has patient had a PCN reaction causing severe rash involving mucus membranes or skin necrosis: No Has patient had a PCN reaction that required hospitalization Yes Has patient had a PCN reaction occurring within the last 10 years: No If all of the above answers are "NO", then may proceed with Cephalosporin use.     . Sulfa Antibiotics Rash    ROS Review of Systems  Constitutional: Negative for appetite change, chills, fatigue and fever.  HENT: Negative for congestion, dental problem, ear pain and sore throat.   Eyes: Negative for discharge, redness and visual disturbance.  Respiratory: Negative for cough, chest tightness, shortness of breath and wheezing.   Cardiovascular: Negative for chest pain, palpitations and leg swelling.  Gastrointestinal: Negative for abdominal pain, blood in stool, diarrhea, nausea and vomiting.  Genitourinary: Negative for difficulty urinating, dysuria, flank pain, frequency, hematuria and urgency.  Musculoskeletal: Negative for arthralgias, back pain, joint swelling, myalgias and neck stiffness.  Skin: Negative for  pallor and rash.  Neurological: Negative for dizziness, speech difficulty, weakness and headaches.  Hematological: Negative for adenopathy. Does not bruise/bleed easily.  Psychiatric/Behavioral: Negative for confusion and sleep disturbance. The patient is not nervous/anxious.     PE; Blood pressure 133/90, pulse 81, temperature 98 F (36.7 C), temperature source Oral, resp. rate 16, height 5' 6.25" (1.683 m), weight 215 lb (97.5 kg), SpO2 99 %. Body mass index is 34.44 kg/m. Pt examined with Herbert SetaHeather  Fanny Skates, CMA, as Biomedical engineer.  Gen: Alert, well appearing.  Patient is oriented to person, place, time, and situation. AFFECT: pleasant, lucid thought and speech. ENT: Ears: EACs clear, normal epithelium.  TMs with good light reflex and landmarks bilaterally.  Eyes: no injection, icteris, swelling, or exudate.  EOMI, PERRLA. Nose: no drainage or turbinate edema/swelling.  No injection or focal lesion.  Mouth: lips without lesion/swelling.  Oral mucosa pink and moist.  Dentition intact and without obvious caries or gingival swelling.  Oropharynx without erythema, exudate, or swelling.  Neck: supple/nontender.  No LAD, mass, or TM.  Carotid pulses 2+ bilaterally, without bruits. CV: RRR, no m/r/g.   LUNGS: CTA bilat, nonlabored resps, good aeration in all lung fields. ABD: soft, NT, ND, BS normal.  No hepatospenomegaly or mass.  No bruits. EXT: no clubbing, cyanosis, or edema.  Musculoskeletal: no joint swelling, erythema, warmth, or tenderness.  ROM of all joints intact. Skin - no sores or suspicious lesions or rashes or color changes   Pertinent labs:  Lab Results  Component Value Date   TSH 3.47 02/13/2017   Lab Results  Component Value Date   WBC 8.0 02/13/2017   HGB 13.6 02/13/2017   HCT 39.8 02/13/2017   MCV 89.5 02/13/2017   PLT 307.0 02/13/2017   Lab Results  Component Value Date   CREATININE 0.71 02/13/2017   BUN 12 02/13/2017   NA 138 02/13/2017   K 4.0 02/13/2017   CL  103 02/13/2017   CO2 29 02/13/2017   Lab Results  Component Value Date   ALT 11 02/13/2017   AST 16 02/13/2017   ALKPHOS 68 02/13/2017   BILITOT 0.8 02/13/2017   Lab Results  Component Value Date   CHOL 142 02/13/2017   Lab Results  Component Value Date   HDL 41.30 02/13/2017   Lab Results  Component Value Date   LDLCALC 64 02/13/2017   Lab Results  Component Value Date   TRIG 180.0 (H) 02/13/2017   Lab Results  Component Value Date   CHOLHDL 3 02/13/2017    ASSESSMENT AND PLAN:   Health maintenance exam: Reviewed age and gender appropriate health maintenance issues (prudent diet, regular exercise, health risks of tobacco and excessive alcohol, use of seatbelts, fire alarms in home, use of sunscreen).  Also reviewed age and gender appropriate health screening as well as vaccine recommendations. Vaccines: she is appropriately UTD. Labs: fasting HP labs today. Cervical ca screening: pt to see her GYN this month. Breast ca screening: UTD 02/2017 per pt-->she'll get repeat through her GYN later this month. Colon ca screening: average risk patient= as per latest guidelines, start screening at 45-50 yrs of age.  HTN: continue labetolol as she is taking it (1 qd) and monitor bp. Instructions: Check blood pressure once a day (alternate morning one day and evening the next) for the next week  An After Visit Summary was printed and given to the patient.  FOLLOW UP:  Return in about 6 months (around 09/03/2018) for routine chronic illness f/u.  Signed:  Santiago Bumpers, MD           03/04/2018

## 2018-03-04 NOTE — Patient Instructions (Signed)
Check blood pressure once a day (alternate morning one day and evening the next) for the next week  Health Maintenance, Female Adopting a healthy lifestyle and getting preventive care can go a long way to promote health and wellness. Talk with your health care provider about what schedule of regular examinations is right for you. This is a good chance for you to check in with your provider about disease prevention and staying healthy. In between checkups, there are plenty of things you can do on your own. Experts have done a lot of research about which lifestyle changes and preventive measures are most likely to keep you healthy. Ask your health care provider for more information. Weight and diet Eat a healthy diet  Be sure to include plenty of vegetables, fruits, low-fat dairy products, and lean protein.  Do not eat a lot of foods high in solid fats, added sugars, or salt.  Get regular exercise. This is one of the most important things you can do for your health. ? Most adults should exercise for at least 150 minutes each week. The exercise should increase your heart rate and make you sweat (moderate-intensity exercise). ? Most adults should also do strengthening exercises at least twice a week. This is in addition to the moderate-intensity exercise.  Maintain a healthy weight  Body mass index (BMI) is a measurement that can be used to identify possible weight problems. It estimates body fat based on height and weight. Your health care provider can help determine your BMI and help you achieve or maintain a healthy weight.  For females 53 years of age and older: ? A BMI below 18.5 is considered underweight. ? A BMI of 18.5 to 24.9 is normal. ? A BMI of 25 to 29.9 is considered overweight. ? A BMI of 30 and above is considered obese.  Watch levels of cholesterol and blood lipids  You should start having your blood tested for lipids and cholesterol at 43 years of age, then have this test  every 5 years.  You may need to have your cholesterol levels checked more often if: ? Your lipid or cholesterol levels are high. ? You are older than 43 years of age. ? You are at high risk for heart disease.  Cancer screening Lung Cancer  Lung cancer screening is recommended for adults 36-75 years old who are at high risk for lung cancer because of a history of smoking.  A yearly low-dose CT scan of the lungs is recommended for people who: ? Currently smoke. ? Have quit within the past 15 years. ? Have at least a 30-pack-year history of smoking. A pack year is smoking an average of one pack of cigarettes a day for 1 year.  Yearly screening should continue until it has been 15 years since you quit.  Yearly screening should stop if you develop a health problem that would prevent you from having lung cancer treatment.  Breast Cancer  Practice breast self-awareness. This means understanding how your breasts normally appear and feel.  It also means doing regular breast self-exams. Let your health care provider know about any changes, no matter how small.  If you are in your 20s or 30s, you should have a clinical breast exam (CBE) by a health care provider every 1-3 years as part of a regular health exam.  If you are 82 or older, have a CBE every year. Also consider having a breast X-ray (mammogram) every year.  If you have a family history  of breast cancer, talk to your health care provider about genetic screening.  If you are at high risk for breast cancer, talk to your health care provider about having an MRI and a mammogram every year.  Breast cancer gene (BRCA) assessment is recommended for women who have family members with BRCA-related cancers. BRCA-related cancers include: ? Breast. ? Ovarian. ? Tubal. ? Peritoneal cancers.  Results of the assessment will determine the need for genetic counseling and BRCA1 and BRCA2 testing.  Cervical Cancer Your health care provider  may recommend that you be screened regularly for cancer of the pelvic organs (ovaries, uterus, and vagina). This screening involves a pelvic examination, including checking for microscopic changes to the surface of your cervix (Pap test). You may be encouraged to have this screening done every 3 years, beginning at age 41.  For women ages 48-65, health care providers may recommend pelvic exams and Pap testing every 3 years, or they may recommend the Pap and pelvic exam, combined with testing for human papilloma virus (HPV), every 5 years. Some types of HPV increase your risk of cervical cancer. Testing for HPV may also be done on women of any age with unclear Pap test results.  Other health care providers may not recommend any screening for nonpregnant women who are considered low risk for pelvic cancer and who do not have symptoms. Ask your health care provider if a screening pelvic exam is right for you.  If you have had past treatment for cervical cancer or a condition that could lead to cancer, you need Pap tests and screening for cancer for at least 20 years after your treatment. If Pap tests have been discontinued, your risk factors (such as having a new sexual partner) need to be reassessed to determine if screening should resume. Some women have medical problems that increase the chance of getting cervical cancer. In these cases, your health care provider may recommend more frequent screening and Pap tests.  Colorectal Cancer  This type of cancer can be detected and often prevented.  Routine colorectal cancer screening usually begins at 43 years of age and continues through 43 years of age.  Your health care provider may recommend screening at an earlier age if you have risk factors for colon cancer.  Your health care provider may also recommend using home test kits to check for hidden blood in the stool.  A small camera at the end of a tube can be used to examine your colon directly  (sigmoidoscopy or colonoscopy). This is done to check for the earliest forms of colorectal cancer.  Routine screening usually begins at age 74.  Direct examination of the colon should be repeated every 5-10 years through 43 years of age. However, you may need to be screened more often if early forms of precancerous polyps or small growths are found.  Skin Cancer  Check your skin from head to toe regularly.  Tell your health care provider about any new moles or changes in moles, especially if there is a change in a mole's shape or color.  Also tell your health care provider if you have a mole that is larger than the size of a pencil eraser.  Always use sunscreen. Apply sunscreen liberally and repeatedly throughout the day.  Protect yourself by wearing long sleeves, pants, a wide-brimmed hat, and sunglasses whenever you are outside.  Heart disease, diabetes, and high blood pressure  High blood pressure causes heart disease and increases the risk of stroke. High  blood pressure is more likely to develop in: ? People who have blood pressure in the high end of the normal range (130-139/85-89 mm Hg). ? People who are overweight or obese. ? People who are African American.  If you are 18-39 years of age, have your blood pressure checked every 3-5 years. If you are 40 years of age or older, have your blood pressure checked every year. You should have your blood pressure measured twice-once when you are at a hospital or clinic, and once when you are not at a hospital or clinic. Record the average of the two measurements. To check your blood pressure when you are not at a hospital or clinic, you can use: ? An automated blood pressure machine at a pharmacy. ? A home blood pressure monitor.  If you are between 55 years and 79 years old, ask your health care provider if you should take aspirin to prevent strokes.  Have regular diabetes screenings. This involves taking a blood sample to check your  fasting blood sugar level. ? If you are at a normal weight and have a low risk for diabetes, have this test once every three years after 43 years of age. ? If you are overweight and have a high risk for diabetes, consider being tested at a younger age or more often. Preventing infection Hepatitis B  If you have a higher risk for hepatitis B, you should be screened for this virus. You are considered at high risk for hepatitis B if: ? You were born in a country where hepatitis B is common. Ask your health care provider which countries are considered high risk. ? Your parents were born in a high-risk country, and you have not been immunized against hepatitis B (hepatitis B vaccine). ? You have HIV or AIDS. ? You use needles to inject street drugs. ? You live with someone who has hepatitis B. ? You have had sex with someone who has hepatitis B. ? You get hemodialysis treatment. ? You take certain medicines for conditions, including cancer, organ transplantation, and autoimmune conditions.  Hepatitis C  Blood testing is recommended for: ? Everyone born from 1945 through 1965. ? Anyone with known risk factors for hepatitis C.  Sexually transmitted infections (STIs)  You should be screened for sexually transmitted infections (STIs) including gonorrhea and chlamydia if: ? You are sexually active and are younger than 43 years of age. ? You are older than 43 years of age and your health care provider tells you that you are at risk for this type of infection. ? Your sexual activity has changed since you were last screened and you are at an increased risk for chlamydia or gonorrhea. Ask your health care provider if you are at risk.  If you do not have HIV, but are at risk, it may be recommended that you take a prescription medicine daily to prevent HIV infection. This is called pre-exposure prophylaxis (PrEP). You are considered at risk if: ? You are sexually active and do not regularly use condoms  or know the HIV status of your partner(s). ? You take drugs by injection. ? You are sexually active with a partner who has HIV.  Talk with your health care provider about whether you are at high risk of being infected with HIV. If you choose to begin PrEP, you should first be tested for HIV. You should then be tested every 3 months for as long as you are taking PrEP. Pregnancy  If you are   premenopausal and you may become pregnant, ask your health care provider about preconception counseling.  If you may become pregnant, take 400 to 800 micrograms (mcg) of folic acid every day.  If you want to prevent pregnancy, talk to your health care provider about birth control (contraception). Osteoporosis and menopause  Osteoporosis is a disease in which the bones lose minerals and strength with aging. This can result in serious bone fractures. Your risk for osteoporosis can be identified using a bone density scan.  If you are 58 years of age or older, or if you are at risk for osteoporosis and fractures, ask your health care provider if you should be screened.  Ask your health care provider whether you should take a calcium or vitamin D supplement to lower your risk for osteoporosis.  Menopause may have certain physical symptoms and risks.  Hormone replacement therapy may reduce some of these symptoms and risks. Talk to your health care provider about whether hormone replacement therapy is right for you. Follow these instructions at home:  Schedule regular health, dental, and eye exams.  Stay current with your immunizations.  Do not use any tobacco products including cigarettes, chewing tobacco, or electronic cigarettes.  If you are pregnant, do not drink alcohol.  If you are breastfeeding, limit how much and how often you drink alcohol.  Limit alcohol intake to no more than 1 drink per day for nonpregnant women. One drink equals 12 ounces of beer, 5 ounces of wine, or 1 ounces of hard  liquor.  Do not use street drugs.  Do not share needles.  Ask your health care provider for help if you need support or information about quitting drugs.  Tell your health care provider if you often feel depressed.  Tell your health care provider if you have ever been abused or do not feel safe at home. This information is not intended to replace advice given to you by your health care provider. Make sure you discuss any questions you have with your health care provider. Document Released: 09/26/2010 Document Revised: 08/19/2015 Document Reviewed: 12/15/2014 Elsevier Interactive Patient Education  [employer]. . Continue taking your blood pressure medication (labetolol) once per day. Blood pressure goal is <130 on top and <80 on bottom.  Call our office if bp not consistently at goal.

## 2018-03-05 ENCOUNTER — Telehealth: Payer: Self-pay | Admitting: Family Medicine

## 2018-03-05 NOTE — Telephone Encounter (Signed)
Pt given lab results per notes of Dr Milinda CaveMcGowen on 03/04/18. Pt verbalized understanding.

## 2018-05-24 DIAGNOSIS — Z1231 Encounter for screening mammogram for malignant neoplasm of breast: Secondary | ICD-10-CM | POA: Diagnosis not present

## 2018-05-24 DIAGNOSIS — Z6834 Body mass index (BMI) 34.0-34.9, adult: Secondary | ICD-10-CM | POA: Diagnosis not present

## 2018-05-24 DIAGNOSIS — Z01419 Encounter for gynecological examination (general) (routine) without abnormal findings: Secondary | ICD-10-CM | POA: Diagnosis not present

## 2018-05-24 LAB — HM MAMMOGRAPHY

## 2018-05-29 ENCOUNTER — Encounter: Payer: Self-pay | Admitting: Family Medicine

## 2018-09-04 ENCOUNTER — Ambulatory Visit: Payer: BLUE CROSS/BLUE SHIELD | Admitting: Family Medicine

## 2018-09-19 ENCOUNTER — Other Ambulatory Visit: Payer: Self-pay | Admitting: Family Medicine

## 2018-09-24 ENCOUNTER — Other Ambulatory Visit: Payer: Self-pay

## 2018-09-24 IMAGING — DX DG FOOT 2V*R*
2 series · 2 of 2 positions shown · non-contrast
Comparison: No recent prior.

CLINICAL DATA: Left foot/heel pain.  No known injury.

EXAM:
RIGHT FOOT - 2 VIEW

[foot ap]
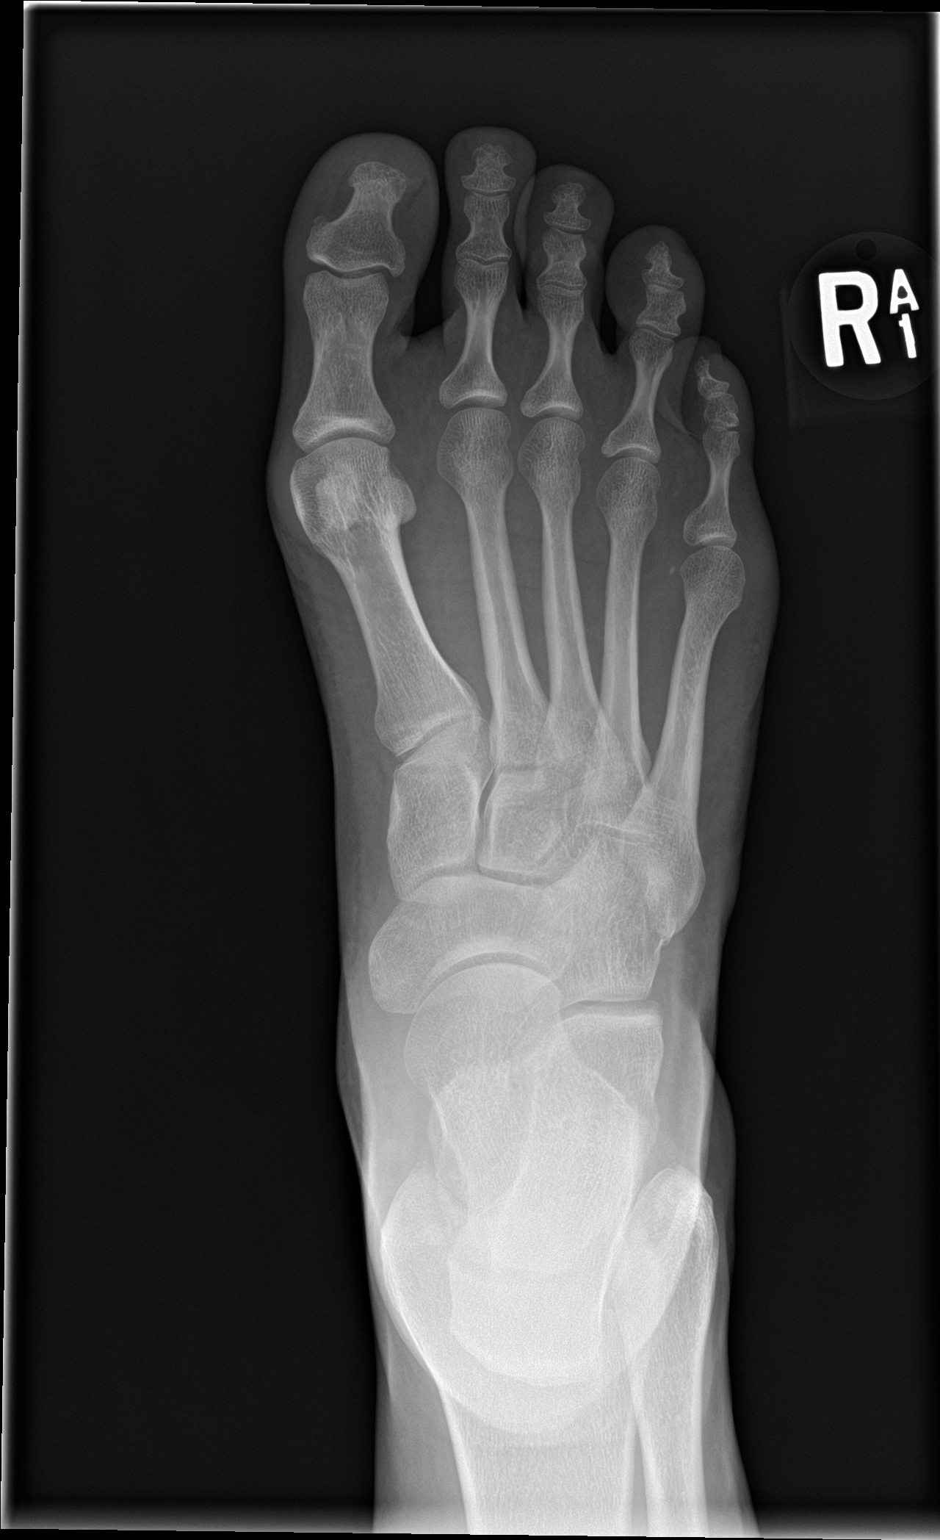

[foot lat]
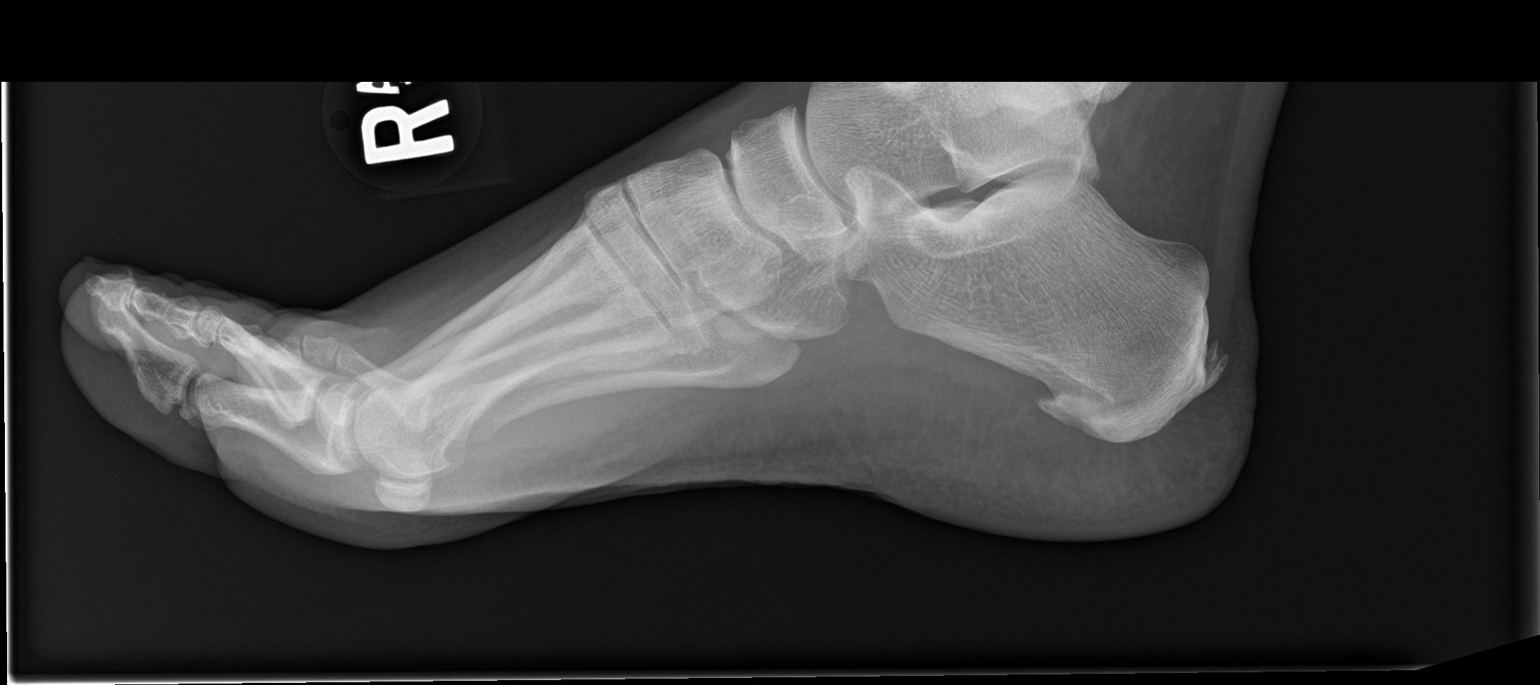

[2 of 2 positions shown; findings below may reference images not displayed]

FINDINGS: No acute bony or joint abnormality. No evidence of fracture. Mild
calcaneal spurring. Soft tissues unremarkable. No radiopaque foreign
body.
IMPRESSION: Mild calcaneal spurring, otherwise negative exam.

## 2018-09-24 MED ORDER — SERTRALINE HCL 100 MG PO TABS: 100.0000 mg | ORAL_TABLET | Freq: Every day | ORAL | 0 refills | Status: DC

## 2019-01-07 ENCOUNTER — Other Ambulatory Visit: Payer: Self-pay

## 2019-01-07 MED ORDER — SERTRALINE HCL 100 MG PO TABS: 100.0000 mg | ORAL_TABLET | Freq: Every day | ORAL | 0 refills | Status: DC

## 2019-01-14 ENCOUNTER — Ambulatory Visit (INDEPENDENT_AMBULATORY_CARE_PROVIDER_SITE_OTHER): Payer: BC Managed Care – PPO

## 2019-01-14 ENCOUNTER — Other Ambulatory Visit: Payer: Self-pay

## 2019-01-14 DIAGNOSIS — Z23 Encounter for immunization: Secondary | ICD-10-CM

## 2019-03-28 DIAGNOSIS — E781 Pure hyperglyceridemia: Secondary | ICD-10-CM

## 2019-03-28 HISTORY — DX: Pure hyperglyceridemia: E78.1

## 2019-04-24 ENCOUNTER — Other Ambulatory Visit: Payer: Self-pay

## 2019-04-24 ENCOUNTER — Ambulatory Visit (INDEPENDENT_AMBULATORY_CARE_PROVIDER_SITE_OTHER): Payer: BC Managed Care – PPO | Admitting: Family Medicine

## 2019-04-24 ENCOUNTER — Encounter: Payer: Self-pay | Admitting: Family Medicine

## 2019-04-24 VITALS — BP 105/73 | HR 75

## 2019-04-24 DIAGNOSIS — F411 Generalized anxiety disorder: Secondary | ICD-10-CM | POA: Diagnosis not present

## 2019-04-24 DIAGNOSIS — I10 Essential (primary) hypertension: Secondary | ICD-10-CM

## 2019-04-24 MED ORDER — SERTRALINE HCL 100 MG PO TABS: 100.0000 mg | ORAL_TABLET | Freq: Every day | ORAL | 3 refills | Status: DC

## 2019-04-24 MED ORDER — LABETALOL HCL 300 MG PO TABS: ORAL_TABLET | ORAL | 1 refills | Status: DC

## 2019-04-24 NOTE — Progress Notes (Signed)
Virtual Visit via Video Note  I connected with pt on 04/24/19 at  3:30 PM EST by a video enabled telemedicine application and verified that I am speaking with the correct person using two identifiers.  Location patient: home Location provider:work or home office Persons participating in the virtual visit: patient, provider  I discussed the limitations of evaluation and management by telemedicine and the availability of in person appointments. The patient expressed understanding and agreed to proceed.  Telemedicine visit is a necessity given the COVID-19 restrictions in place at the current time.  HPI: 44 y/o WF being seen today for f/u HTN and GAD., hx of MDD. Doing well. Home bp's consistently <130/80. Taking labetolol 300 mg only once daily.  Mood and anxiety levels are fine. She is actually enjoying the increased time at home with her family. She takes 100mg  zoloft qd.  ROS: See pertinent positives and negatives per HPI.  Past Medical History:  Diagnosis Date  . Anxiety    zoloft 25mg  helps a lot  . Complication of anesthesia    leg weakness for 1 year after epidural in 2001  . Depression    after pregnancy loss was on zoloft  . Hypertension    during pregnancy, + white coat (trial of enalapril  + ACE/HCTZ caused bp too low/sluggish.  Repeated home bp measurements normal.  . Lumbar spondylosis 07/27/11   with grade I spondylolisthesis (x-ray at chiropracter's office)  . Plantar fasciitis of right foot 07/2017   Dr. 09/26/11  . Pregnancy-induced hypertension, history with 1st preg 05/09/2011; 2015/16    Past Surgical History:  Procedure Laterality Date  . BARTHOLIN GLAND CYST EXCISION     7-8 yrs ago  . CHOLECYSTECTOMY  07/07/2011   Procedure: LAPAROSCOPIC CHOLECYSTECTOMY;  Surgeon: 2016/2, MD;  Location: [facility];  Service: General;  Laterality: N/A;  . CYSTECTOMY  2007   vulvar cyst  . EYE SURGERY     lasik  . INTRAUTERINE DEVICE (IUD) INSERTION  01/01/2017   Mirena    Family History  Problem Relation Age of Onset  . Peripheral vascular disease Mother   . Depression Mother   . Hypertension Father   . Kidney disease Father        Bright's Disease as a child  . Hypertension Paternal Aunt   . Thyroid disease Maternal Grandmother   . Cancer Maternal Grandmother        bladder   . Stroke Paternal Grandmother   . Parkinsonism Paternal Grandmother   . Stroke Paternal Grandfather   . Hypertension Maternal Uncle   . Anesthesia problems Neg Hx   . Hypotension Neg Hx   . Malignant hyperthermia Neg Hx   . Pseudochol deficiency Neg Hx    Social History   Socioeconomic History  . Marital status: Legally Separated    Spouse name: Not on file  . Number of children: Not on file  . Years of education: Not on file  . Highest education level: Not on file  Occupational History  . Not on file  Tobacco Use  . Smoking status: Never Smoker  . Smokeless tobacco: Never Used  Substance and Sexual Activity  . Alcohol use: No  . Drug use: No  . Sexual activity: Yes    Partners: Male    Birth control/protection: None  Other Topics Concern  . Not on file  Social History Narrative   Separated/divorced 2018, 4 children --2 sons and 2 daughters.   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   Ex-husband works in Engineer, technical sales at [employer] in [place].   No T/A/Ds.   Exercise--"chasing kids around".  Normal american diet.         Social Determinants of Health   Financial Resource Strain:   . Difficulty of Paying Living Expenses: Not on file  Food Insecurity:   . Worried About Charity fundraiser in the Last Year: Not on file  . Ran Out of Food in the Last Year: Not on file  Transportation Needs:   . Lack of Transportation (Medical): Not on file  . Lack of Transportation (Non-Medical): Not on file  Physical Activity:   . Days of Exercise per Week: Not on file  . Minutes of Exercise per Session: Not on file  Stress:   . Feeling of Stress :  Not on file  Social Connections:   . Frequency of Communication with Friends and Family: Not on file  . Frequency of Social Gatherings with Friends and Family: Not on file  . Attends Religious Services: Not on file  . Active Member of Clubs or Organizations: Not on file  . Attends Archivist Meetings: Not on file  . Marital Status: Not on file      Current Outpatient Medications:  .  CRANBERRY PO, Take 2 capsules daily by mouth., Disp: , Rfl:  .  labetalol (NORMODYNE) 300 MG tablet, 1 tab po bid, Disp: 60 tablet, Rfl: 5 .  levonorgestrel (MIRENA, 52 MG,) 20 MCG/24HR IUD, Mirena 20 mcg/24 hours (5 yrs) 52 mg intrauterine device  Take 1 device by intrauterine route., Disp: , Rfl:  .  Multiple Vitamin (MULTIVITAMIN) tablet, Take 2 tablets daily by mouth. , Disp: , Rfl:  .  omega-3 acid ethyl esters (LOVAZA) 1 g capsule, Take 1 g by mouth daily., Disp: , Rfl:  .  Probiotic Product (PROBIOTIC PO), Take 1 capsule by mouth daily., Disp: , Rfl:  .  sertraline (ZOLOFT) 100 MG tablet, Take 1 tablet (100 mg total) by mouth daily. OFFICE VISIT NEEDED, Disp: 30 tablet, Rfl: 0  EXAM:  VITALS per patient if applicable: BP 130/86 (BP Location: Left Arm, Patient Position: Sitting, Cuff Size: Large)   Pulse 75    GENERAL: alert, oriented, appears well and in no acute distress  HEENT: atraumatic, conjunttiva clear, no obvious abnormalities on inspection of external nose and ears  NECK: normal movements of the head and neck  LUNGS: on inspection no signs of respiratory distress, breathing rate appears normal, no obvious gross SOB, gasping or wheezing  CV: no obvious cyanosis  MS: moves all visible extremities without noticeable abnormality  PSYCH/NEURO: pleasant and cooperative, no obvious depression or anxiety, speech and thought processing grossly intact  LABS: none today    Chemistry      Component Value Date/Time   NA 139 03/04/2018 1052   K 4.0 03/04/2018 1052   CL 103  03/04/2018 1052   CO2 29 03/04/2018 1052   BUN 14 03/04/2018 1052   CREATININE 0.78 03/04/2018 1052      Component Value Date/Time   CALCIUM 9.5 03/04/2018 1052   ALKPHOS 72 03/04/2018 1052   AST 17 03/04/2018 1052   ALT 13 03/04/2018 1052   BILITOT 0.7 03/04/2018 1052      ASSESSMENT AND PLAN:  Discussed the following assessment and plan:  1) HTN: The current medical regimen is effective;  continue present plan and medications.  2) GAD/hx of MDD: in remission long term.  Continue zoloft 100mg  qd.   I discussed the assessment and treatment plan with the patient. The patient was provided an opportunity to ask questions and all were answered. The patient agreed with the plan and demonstrated an understanding of the instructions.   The patient was advised to call back or seek an in-person evaluation if the symptoms worsen or if the condition fails to improve as anticipated.  F/u:  1 yr cpe or RCI  Signed:  , MD           04/24/2019

## 2019-10-24 ENCOUNTER — Other Ambulatory Visit: Payer: Self-pay

## 2019-10-27 ENCOUNTER — Ambulatory Visit (INDEPENDENT_AMBULATORY_CARE_PROVIDER_SITE_OTHER): Payer: BC Managed Care – PPO | Admitting: Family Medicine

## 2019-10-27 ENCOUNTER — Encounter: Payer: BC Managed Care – PPO | Admitting: Family Medicine

## 2019-10-27 ENCOUNTER — Other Ambulatory Visit: Payer: Self-pay

## 2019-10-27 DIAGNOSIS — I10 Essential (primary) hypertension: Secondary | ICD-10-CM | POA: Diagnosis not present

## 2019-10-27 NOTE — Progress Notes (Deleted)
Office Note 10/27/2019  CC: No chief complaint on file.   HPI:  Mary Gross is a 44 y.o. White female who is here for annual health maintenance exam, + f/u HTN and dep/anx.    Past Medical History:  Diagnosis Date  . Anxiety    zoloft 25mg  helps a lot  . Complication of anesthesia    leg weakness for 1 year after epidural in 2001  . Depression    after pregnancy loss was on zoloft  . Hypertension    during pregnancy, + white coat (trial of enalapril  + ACE/HCTZ caused bp too low/sluggish.  Repeated home bp measurements normal.  . Lumbar spondylosis 07/27/11   with grade I spondylolisthesis (x-ray at chiropracter's office)  . Plantar fasciitis of right foot 07/2017   Dr. 08/2017  . Pregnancy-induced hypertension, history with 1st preg 05/09/2011; 2015/16    Past Surgical History:  Procedure Laterality Date  . BARTHOLIN GLAND CYST EXCISION     7-8 yrs ago  . CHOLECYSTECTOMY  07/07/2011   Procedure: LAPAROSCOPIC CHOLECYSTECTOMY;  Surgeon: 09/06/2011, MD;  Location: [facility];  Service: General;  Laterality: N/A;  . CYSTECTOMY  2007   vulvar cyst  . EYE SURGERY     lasik  . INTRAUTERINE DEVICE (IUD) INSERTION  01/01/2017   Mirena    Family History  Problem Relation Age of Onset  . Peripheral vascular disease Mother   . Depression Mother   . Hypertension Father   . Kidney disease Father        Bright's Disease as a child  . Hypertension Paternal Aunt   . Thyroid disease Maternal Grandmother   . Cancer Maternal Grandmother        bladder   . Stroke Paternal Grandmother   . Parkinsonism Paternal Grandmother   . Stroke Paternal Grandfather   . Hypertension Maternal Uncle   . Anesthesia problems Neg Hx   . Hypotension Neg Hx   . Malignant hyperthermia Neg Hx   . Pseudochol deficiency Neg Hx     Social History   Socioeconomic History  . Marital status: Legally Separated    Spouse name: Not on file  . Number of children: Not on file  . Years of education:  Not on file  . Highest education level: Not on file  Occupational History  . Not on file  Tobacco Use  . Smoking status: Never Smoker  . Smokeless tobacco: Never Used  Substance and Sexual Activity  . Alcohol use: No  . Drug use: No  . Sexual activity: Yes    Partners: Male    Birth control/protection: None  Other Topics Concern  . Not on file  Social History Narrative   Separated/divorced 2018, 4 children --2 sons and 2 daughters.   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     Stay at home mom.   Ex-husband works in 2019 at [employer]ing civil [employer] in [place].   No T/A/Ds.   Exercise--"chasing kids around".  Normal american diet.         Social Determinants of Health   Financial Resource Strain:   . Difficulty of Paying Living Expenses:   Food Insecurity:   . Worried About [place] in the Last Year:   . Programme researcher, broadcasting/film/video in the Last Year:   Transportation Needs:   . Barista (Medical):   Freight forwarder Lack of Transportation (Non-Medical):   Physical Activity:   . Days of Exercise per Week:   . Minutes  of Exercise per Session:   Stress:   . Feeling of Stress :   Social Connections:   . Frequency of Communication with Friends and Family:   . Frequency of Social Gatherings with Friends and Family:   . Attends Religious Services:   . Active Member of Clubs or Organizations:   . Attends Banker Meetings:   Marland Kitchen Marital Status:   Intimate Partner Violence:   . Fear of Current or Ex-Partner:   . Emotionally Abused:   Marland Kitchen Physically Abused:   . Sexually Abused:     Outpatient Medications Prior to Visit  Medication Sig Dispense Refill  . CRANBERRY PO Take 2 capsules daily by mouth.    . labetalol (NORMODYNE) 300 MG tablet 1 tab po bid 180 tablet 1  . Lactobacillus Rhamnosus, GG, (RA PROBIOTIC DIGESTIVE CARE) CAPS Take 1 capsule by mouth daily.    Marland Kitchen levonorgestrel (MIRENA, 52 MG,) 20 MCG/24HR IUD Mirena 20 mcg/24 hours (5 yrs) 52 mg intrauterine device  Take 1 device by  intrauterine route.    . Multiple Vitamin (MULTIVITAMIN) tablet Take 2 tablets daily by mouth.     . omega-3 acid ethyl esters (LOVAZA) 1 g capsule Take 1 g by mouth daily.    . Omega-3 Fatty Acids (FISH OIL) 1000 MG CAPS Take by mouth.    . Probiotic Product (PROBIOTIC PO) Take 1 capsule by mouth daily.    . sertraline (ZOLOFT) 100 MG tablet Take 1 tablet (100 mg total) by mouth daily. OFFICE VISIT NEEDED 90 tablet 3   No facility-administered medications prior to visit.    Allergies  Allergen Reactions  . Hctz [Hydrochlorothiazide] Other (See Comments)    Malaise/HA/orthostatic sx's  . Ciprofloxacin Rash    Itchy/burning skin with MINIMAL rash per pt report  . Codeine Rash  . Iron Rash  . Penicillins Rash    Has patient had a PCN reaction causing immediate rash, facial/tongue/throat swelling, SOB or lightheadedness with hypotension: No Has patient had a PCN reaction causing severe rash involving mucus membranes or skin necrosis: No Has patient had a PCN reaction that required hospitalization Yes Has patient had a PCN reaction occurring within the last 10 years: No If all of the above answers are "NO", then may proceed with Cephalosporin use.     . Sulfa Antibiotics Rash    ROS *** PE; There were no vitals taken for this visit. *** Pertinent labs:  Lab Results  Component Value Date   TSH 1.49 03/04/2018   Lab Results  Component Value Date   WBC 8.4 03/04/2018   HGB 13.6 03/04/2018   HCT 39.9 03/04/2018   MCV 89.9 03/04/2018   PLT 287.0 03/04/2018   Lab Results  Component Value Date   CREATININE 0.78 03/04/2018   BUN 14 03/04/2018   NA 139 03/04/2018   K 4.0 03/04/2018   CL 103 03/04/2018   CO2 29 03/04/2018   Lab Results  Component Value Date   ALT 13 03/04/2018   AST 17 03/04/2018   ALKPHOS 72 03/04/2018   BILITOT 0.7 03/04/2018   Lab Results  Component Value Date   CHOL 153 03/04/2018   Lab Results  Component Value Date   HDL 43.70 03/04/2018    Lab Results  Component Value Date   LDLCALC 74 03/04/2018   Lab Results  Component Value Date   TRIG 175.0 (H) 03/04/2018   Lab Results  Component Value Date   CHOLHDL 3 03/04/2018   ASSESSMENT AND  PLAN:   Health maintenance exam: Reviewed age and gender appropriate health maintenance issues (prudent diet, regular exercise, health risks of tobacco and excessive alcohol, use of seatbelts, fire alarms in home, use of sunscreen).  Also reviewed age and gender appropriate health screening as well as vaccine recommendations. Vaccines: Tdap UTD.  Covid 19->***. Labs: needs fasting HP-->ordered. Cervical ca screening:  [facility]. Breast ca screening: 04/2019 mammogram done, [facility]. Colon ca screening: start colon ca screening at/after age 45-->***.  An After Visit Summary was printed and given to the patient.  FOLLOW UP:  No follow-ups on file.  Signed:  Santiago Bumpers, MD           10/27/2019

## 2019-10-28 LAB — TSH: TSH: 1.72 u[IU]/mL (ref 0.35–4.50)

## 2019-10-28 LAB — COMPREHENSIVE METABOLIC PANEL
ALT: 16 U/L (ref 0–35)
AST: 16 U/L (ref 0–37)
Albumin: 4.4 g/dL (ref 3.5–5.2)
Alkaline Phosphatase: 80 U/L (ref 39–117)
BUN: 8 mg/dL (ref 6–23)
CO2: 28 mEq/L (ref 19–32)
Calcium: 9.8 mg/dL (ref 8.4–10.5)
Chloride: 102 mEq/L (ref 96–112)
Creatinine, Ser: 0.75 mg/dL (ref 0.40–1.20)
GFR: 83.72 mL/min (ref >60.00)
Glucose, Bld: 90 mg/dL (ref 70–99)
Potassium: 4.2 mEq/L (ref 3.5–5.1)
Sodium: 137 mEq/L (ref 135–145)
Total Bilirubin: 0.8 mg/dL (ref 0.2–1.2)
Total Protein: 7.1 g/dL (ref 6.0–8.3)

## 2019-10-28 LAB — CBC WITH DIFFERENTIAL/PLATELET
Basophils Absolute: 0.2 10*3/uL — ABNORMAL HIGH (ref 0.0–0.1)
Basophils Relative: 1.7 % (ref 0.0–3.0)
Eosinophils Absolute: 0.2 10*3/uL (ref 0.0–0.7)
Eosinophils Relative: 2.5 % (ref 0.0–5.0)
HCT: 40.2 % (ref 36.0–46.0)
Hemoglobin: 13.8 g/dL (ref 12.0–15.0)
Lymphocytes Relative: 25.2 % (ref 12.0–46.0)
Lymphs Abs: 2.5 10*3/uL (ref 0.7–4.0)
MCHC: 34.4 g/dL (ref 30.0–36.0)
MCV: 90.3 fl (ref 78.0–100.0)
Monocytes Absolute: 0.5 10*3/uL (ref 0.1–1.0)
Monocytes Relative: 4.7 % (ref 3.0–12.0)
Neutro Abs: 6.6 10*3/uL (ref 1.4–7.7)
Neutrophils Relative %: 65.9 % (ref 43.0–77.0)
Platelets: 312 10*3/uL (ref 150.0–400.0)
RBC: 4.45 Mil/uL (ref 3.87–5.11)
RDW: 13.4 % (ref 11.5–15.5)
WBC: 10 10*3/uL (ref 4.0–10.5)

## 2019-10-28 LAB — LIPID PANEL
Cholesterol: 162 mg/dL (ref 0–200)
HDL: 39.4 mg/dL (ref >39.00)
NonHDL: 122.69
Total CHOL/HDL Ratio: 4
Triglycerides: 213 mg/dL — ABNORMAL HIGH (ref 0.0–149.0)
VLDL: 42.6 mg/dL — ABNORMAL HIGH (ref 0.0–40.0)

## 2019-10-28 LAB — LDL CHOLESTEROL, DIRECT: Direct LDL: 75 mg/dL

## 2019-10-29 ENCOUNTER — Encounter: Payer: Self-pay | Admitting: Family Medicine

## 2019-11-07 ENCOUNTER — Other Ambulatory Visit: Payer: Self-pay

## 2019-11-07 MED ORDER — LABETALOL HCL 300 MG PO TABS: ORAL_TABLET | ORAL | 0 refills | Status: DC

## 2019-11-19 ENCOUNTER — Encounter: Payer: BC Managed Care – PPO | Admitting: Family Medicine

## 2020-04-28 ENCOUNTER — Encounter: Payer: BC Managed Care – PPO | Admitting: Family Medicine

## 2020-08-19 DIAGNOSIS — Z30432 Encounter for removal of intrauterine contraceptive device: Secondary | ICD-10-CM | POA: Diagnosis not present

## 2020-08-19 HISTORY — PX: IUD REMOVAL: SHX5392

## 2020-08-20 ENCOUNTER — Encounter: Payer: Self-pay | Admitting: Family Medicine

## 2020-09-28 DIAGNOSIS — Z124 Encounter for screening for malignant neoplasm of cervix: Secondary | ICD-10-CM | POA: Diagnosis not present

## 2020-09-28 DIAGNOSIS — Z01419 Encounter for gynecological examination (general) (routine) without abnormal findings: Secondary | ICD-10-CM | POA: Diagnosis not present

## 2020-09-28 DIAGNOSIS — Z1231 Encounter for screening mammogram for malignant neoplasm of breast: Secondary | ICD-10-CM | POA: Diagnosis not present

## 2020-09-28 LAB — HM MAMMOGRAPHY

## 2020-09-29 ENCOUNTER — Other Ambulatory Visit: Payer: Self-pay

## 2020-09-29 MED ORDER — LABETALOL HCL 300 MG PO TABS: ORAL_TABLET | ORAL | 0 refills | Status: DC

## 2020-10-01 LAB — HM PAP SMEAR: HM Pap smear: NORMAL

## 2020-10-04 ENCOUNTER — Other Ambulatory Visit: Payer: Self-pay | Admitting: Family Medicine

## 2020-10-04 MED ORDER — SERTRALINE HCL 100 MG PO TABS: 100.0000 mg | ORAL_TABLET | Freq: Every day | ORAL | 0 refills | Status: DC

## 2020-10-04 NOTE — Telephone Encounter (Signed)
Patient has 2 pills left of sertaline. Patient has appt with Dr. Milinda Cave scheduled on 10/27/20.  sertraline (ZOLOFT) 100 MG tablet [[id number]]    [facility] [phone] - [place], [place]

## 2020-10-05 NOTE — Telephone Encounter (Signed)
Pt had previous rx sent for labetalol 30 d supply. Rx approved for 30 d on sertraline.

## 2020-10-06 ENCOUNTER — Other Ambulatory Visit: Payer: Self-pay | Admitting: Family Medicine

## 2020-10-25 DIAGNOSIS — Z1211 Encounter for screening for malignant neoplasm of colon: Secondary | ICD-10-CM

## 2020-10-25 HISTORY — DX: Encounter for screening for malignant neoplasm of colon: Z12.11

## 2020-10-27 ENCOUNTER — Ambulatory Visit (INDEPENDENT_AMBULATORY_CARE_PROVIDER_SITE_OTHER): Payer: BC Managed Care – PPO | Admitting: Family Medicine

## 2020-10-27 ENCOUNTER — Encounter: Payer: Self-pay | Admitting: Family Medicine

## 2020-10-27 ENCOUNTER — Other Ambulatory Visit: Payer: Self-pay

## 2020-10-27 VITALS — BP 121/85 | HR 83 | Temp 98.1°F | Resp 16 | Ht 67.0 in | Wt 226.0 lb

## 2020-10-27 DIAGNOSIS — Z1159 Encounter for screening for other viral diseases: Secondary | ICD-10-CM

## 2020-10-27 DIAGNOSIS — I10 Essential (primary) hypertension: Secondary | ICD-10-CM

## 2020-10-27 DIAGNOSIS — Z1211 Encounter for screening for malignant neoplasm of colon: Secondary | ICD-10-CM | POA: Diagnosis not present

## 2020-10-27 DIAGNOSIS — Z0000 Encounter for general adult medical examination without abnormal findings: Secondary | ICD-10-CM

## 2020-10-27 LAB — CBC WITH DIFFERENTIAL/PLATELET
Basophils Absolute: 0.1 10*3/uL (ref 0.0–0.1)
Basophils Relative: 1 % (ref 0.0–3.0)
Eosinophils Absolute: 0.2 10*3/uL (ref 0.0–0.7)
Eosinophils Relative: 2.4 % (ref 0.0–5.0)
HCT: 40 % (ref 36.0–46.0)
Hemoglobin: 13.7 g/dL (ref 12.0–15.0)
Lymphocytes Relative: 26.7 % (ref 12.0–46.0)
Lymphs Abs: 2.5 10*3/uL (ref 0.7–4.0)
MCHC: 34.1 g/dL (ref 30.0–36.0)
MCV: 88.8 fl (ref 78.0–100.0)
Monocytes Absolute: 0.5 10*3/uL (ref 0.1–1.0)
Monocytes Relative: 5.6 % (ref 3.0–12.0)
Neutro Abs: 6 10*3/uL (ref 1.4–7.7)
Neutrophils Relative %: 64.3 % (ref 43.0–77.0)
Platelets: 299 10*3/uL (ref 150.0–400.0)
RBC: 4.51 Mil/uL (ref 3.87–5.11)
RDW: 12.8 % (ref 11.5–15.5)
WBC: 9.3 10*3/uL (ref 4.0–10.5)

## 2020-10-27 LAB — COMPREHENSIVE METABOLIC PANEL
ALT: 13 U/L (ref 0–35)
AST: 13 U/L (ref 0–37)
Albumin: 4.2 g/dL (ref 3.5–5.2)
Alkaline Phosphatase: 73 U/L (ref 39–117)
BUN: 11 mg/dL (ref 6–23)
CO2: 24 mEq/L (ref 19–32)
Calcium: 9.6 mg/dL (ref 8.4–10.5)
Chloride: 104 mEq/L (ref 96–112)
Creatinine, Ser: 0.76 mg/dL (ref 0.40–1.20)
GFR: 94.52 mL/min (ref >60.00)
Glucose, Bld: 90 mg/dL (ref 70–99)
Potassium: 4.2 mEq/L (ref 3.5–5.1)
Sodium: 138 mEq/L (ref 135–145)
Total Bilirubin: 0.5 mg/dL (ref 0.2–1.2)
Total Protein: 6.9 g/dL (ref 6.0–8.3)

## 2020-10-27 LAB — LIPID PANEL
Cholesterol: 152 mg/dL (ref 0–200)
HDL: 37.5 mg/dL — ABNORMAL LOW (ref >39.00)
NonHDL: 114.75
Total CHOL/HDL Ratio: 4
Triglycerides: 326 mg/dL — ABNORMAL HIGH (ref 0.0–149.0)
VLDL: 65.2 mg/dL — ABNORMAL HIGH (ref 0.0–40.0)

## 2020-10-27 LAB — TSH: TSH: 2.12 u[IU]/mL (ref 0.35–5.50)

## 2020-10-27 LAB — LDL CHOLESTEROL, DIRECT: Direct LDL: 56 mg/dL

## 2020-10-27 MED ORDER — SERTRALINE HCL 100 MG PO TABS: 100.0000 mg | ORAL_TABLET | Freq: Every day | ORAL | 3 refills | Status: DC

## 2020-10-27 MED ORDER — LABETALOL HCL 300 MG PO TABS: ORAL_TABLET | ORAL | 3 refills | Status: DC

## 2020-10-27 NOTE — Patient Instructions (Signed)
Health Maintenance, Female Adopting a healthy lifestyle and getting preventive care are important in promoting health and wellness. Ask your health care provider about: The right schedule for you to have regular tests and exams. Things you can do on your own to prevent diseases and keep yourself healthy. What should I know about diet, weight, and exercise? Eat a healthy diet  Eat a diet that includes plenty of vegetables, fruits, low-fat dairy products, and lean protein. Do not eat a lot of foods that are high in solid fats, added sugars, or sodium.  Maintain a healthy weight Body mass index (BMI) is used to identify weight problems. It estimates body fat based on height and weight. Your health care provider can help determineyour BMI and help you achieve or maintain a healthy weight. Get regular exercise Get regular exercise. This is one of the most important things you can do for your health. Most adults should: Exercise for at least 150 minutes each week. The exercise should increase your heart rate and make you sweat (moderate-intensity exercise). Do strengthening exercises at least twice a week. This is in addition to the moderate-intensity exercise. Spend less time sitting. Even light physical activity can be beneficial. Watch cholesterol and blood lipids Have your blood tested for lipids and cholesterol at 46 years of age, then havethis test every 5 years. Have your cholesterol levels checked more often if: Your lipid or cholesterol levels are high. You are older than 46 years of age. You are at high risk for heart disease. What should I know about cancer screening? Depending on your health history and family history, you may need to have cancer screening at various ages. This may include screening for: Breast cancer. Cervical cancer. Colorectal cancer. Skin cancer. Lung cancer. What should I know about heart disease, diabetes, and high blood pressure? Blood pressure and heart  disease High blood pressure causes heart disease and increases the risk of stroke. This is more likely to develop in people who have high blood pressure readings, are of African descent, or are overweight. Have your blood pressure checked: Every 3-5 years if you are 18-39 years of age. Every year if you are 40 years old or older. Diabetes Have regular diabetes screenings. This checks your fasting blood sugar level. Have the screening done: Once every three years after age 40 if you are at a normal weight and have a low risk for diabetes. More often and at a younger age if you are overweight or have a high risk for diabetes. What should I know about preventing infection? Hepatitis B If you have a higher risk for hepatitis B, you should be screened for this virus. Talk with your health care provider to find out if you are at risk forhepatitis B infection. Hepatitis C Testing is recommended for: Everyone born from 1945 through 1965. Anyone with known risk factors for hepatitis C. Sexually transmitted infections (STIs) Get screened for STIs, including gonorrhea and chlamydia, if: You are sexually active and are younger than 46 years of age. You are older than 46 years of age and your health care provider tells you that you are at risk for this type of infection. Your sexual activity has changed since you were last screened, and you are at increased risk for chlamydia or gonorrhea. Ask your health care provider if you are at risk. Ask your health care provider about whether you are at high risk for HIV. Your health care provider may recommend a prescription medicine to help   prevent HIV infection. If you choose to take medicine to prevent HIV, you should first get tested for HIV. You should then be tested every 3 months for as long as you are taking the medicine. Pregnancy If you are about to stop having your period (premenopausal) and you may become pregnant, seek counseling before you get  pregnant. Take 400 to 800 micrograms (mcg) of folic acid every day if you become pregnant. Ask for birth control (contraception) if you want to prevent pregnancy. Osteoporosis and menopause Osteoporosis is a disease in which the bones lose minerals and strength with aging. This can result in bone fractures. If you are 65 years old or older, or if you are at risk for osteoporosis and fractures, ask your health care provider if you should: Be screened for bone loss. Take a calcium or vitamin D supplement to lower your risk of fractures. Be given hormone replacement therapy (HRT) to treat symptoms of menopause. Follow these instructions at home: Lifestyle Do not use any products that contain nicotine or tobacco, such as cigarettes, e-cigarettes, and chewing tobacco. If you need help quitting, ask your health care provider. Do not use street drugs. Do not share needles. Ask your health care provider for help if you need support or information about quitting drugs. Alcohol use Do not drink alcohol if: Your health care provider tells you not to drink. You are pregnant, may be pregnant, or are planning to become pregnant. If you drink alcohol: Limit how much you use to 0-1 drink a day. Limit intake if you are breastfeeding. Be aware of how much alcohol is in your drink. In the [place] one drink equals one 12 oz bottle of beer (355 mL), one 5 oz glass of wine (148 mL), or one 1 oz glass of hard liquor (44 mL). General instructions Schedule regular health, dental, and eye exams. Stay current with your vaccines. Tell your health care provider if: You often feel depressed. You have ever been abused or do not feel safe at home. Summary Adopting a healthy lifestyle and getting preventive care are important in promoting health and wellness. Follow your health care provider's instructions about healthy diet, exercising, and getting tested or screened for diseases. Follow your health care provider's  instructions on monitoring your cholesterol and blood pressure. This information is not intended to replace advice given to you by your health care provider. Make sure you discuss any questions you have with your healthcare provider. Document Revised: 03/06/2018 Document Reviewed: 03/06/2018 Elsevier Patient Education  2022 Elsevier Inc.  

## 2020-10-27 NOTE — Progress Notes (Signed)
Office Note 10/27/2020  CC:  Chief Complaint  Patient presents with   Annual Exam    HPI:  Mary Gross is a 45 y.o. White female who is here for annual health maintenance exam and f/u HTN and GAD.  Feeling well. Mood and anxiety level very well controlled, takes sertraline 100 qd. No home bp monitoring.  Compliant with labetalol 300mg  bid. Has been exercising in pool daily. Diet is fairly good.   Past Medical History:  Diagnosis Date   Anxiety    zoloft 25mg  helps a lot   Complication of anesthesia    leg weakness for 1 year after epidural in 2001   Depression    after pregnancy loss was on zoloft   Hypertension    during pregnancy, + white coat (trial of enalapril  + ACE/HCTZ caused bp too low/sluggish.  Labetolol long term.   Hypertriglyceridemia 2021   mild->TLC   Lumbar spondylosis 07/27/2011   with grade I spondylolisthesis (x-ray at chiropracter's office)   Plantar fasciitis of right foot 07/2017   Dr. 09/26/2011   Pregnancy-induced hypertension, history with 1st preg 05/09/2011; 2015/16    Past Surgical History:  Procedure Laterality Date   BARTHOLIN GLAND CYST EXCISION     7-8 yrs ago   CHOLECYSTECTOMY  07/07/2011   Procedure: LAPAROSCOPIC CHOLECYSTECTOMY;  Surgeon: 2016/2, MD;  Location: [facility];  Service: General;  Laterality: N/A;   CYSTECTOMY  2007   vulvar cyst   EYE SURGERY     lasik   INTRAUTERINE DEVICE (IUD) INSERTION  01/01/2017   Mirena   IUD REMOVAL  08/19/2020    Family History  Problem Relation Age of Onset   Peripheral vascular disease Mother    Depression Mother    Hypertension Father    Kidney disease Father        Bright's Disease as a child   Hypertension Paternal Aunt    Thyroid disease Maternal Grandmother    Cancer Maternal Grandmother        bladder    Stroke Paternal Grandmother    Parkinsonism Paternal Grandmother    Stroke Paternal Grandfather    Hypertension Maternal Uncle    Anesthesia problems Neg Hx     Hypotension Neg Hx    Malignant hyperthermia Neg Hx    Pseudochol deficiency Neg Hx     Social History   Socioeconomic History   Marital status: Legally Separated    Spouse name: Not on file   Number of children: Not on file   Years of education: Not on file   Highest education level: Not on file  Occupational History   Not on file  Tobacco Use   Smoking status: Never   Smokeless tobacco: Never  Substance and Sexual Activity   Alcohol use: No   Drug use: No   Sexual activity: Yes    Partners: Male    Birth control/protection: None  Other Topics Concern   Not on file  Social History Narrative   Separated/divorced 2018, 4 children --2 sons and 2 daughters.   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     Stay at home mom.   Ex-husband works in 08/21/2020 at 2019 in [place].   No T/A/Ds.   Exercise--"chasing kids around".  Normal american diet.         Social Determinants of Health   Financial Resource Strain: Not on file  Food Insecurity: Not on file  Transportation Needs: Not on file  Physical Activity: Not on file  Stress: Not on file  Social Connections: Not on file  Intimate Partner Violence: Not on file    Outpatient Medications Prior to Visit  Medication Sig Dispense Refill   CRANBERRY PO Take 2 capsules daily by mouth.     Multiple Vitamin (MULTIVITAMIN) tablet Take 2 tablets daily by mouth.      Omega-3 Fatty Acids (FISH OIL) 1000 MG CAPS Take by mouth.     Probiotic Product (PROBIOTIC PO) Take 1 capsule by mouth daily.     Lactobacillus Rhamnosus, GG, (RA PROBIOTIC DIGESTIVE CARE) CAPS Take 1 capsule by mouth daily.     labetalol (NORMODYNE) 300 MG tablet 1 tab po bid 60 tablet 0   levonorgestrel (MIRENA) 20 MCG/24HR IUD Mirena 20 mcg/24 hours (5 yrs) 52 mg intrauterine device  Take 1 device by intrauterine route. (Patient not taking: Reported on 10/27/2020)     omega-3 acid ethyl esters (LOVAZA) 1 g capsule Take 1 g by mouth daily. (Patient not taking: Reported on  10/27/2020)     sertraline (ZOLOFT) 100 MG tablet Take 1 tablet (100 mg total) by mouth daily. OFFICE VISIT NEEDED 30 tablet 0   No facility-administered medications prior to visit.    Allergies  Allergen Reactions   Hctz [Hydrochlorothiazide] Other (See Comments)    Malaise/HA/orthostatic sx's   Ciprofloxacin Rash    Itchy/burning skin with MINIMAL rash per pt report   Codeine Rash   Iron Rash   Penicillins Rash    Has patient had a PCN reaction causing immediate rash, facial/tongue/throat swelling, SOB or lightheadedness with hypotension: No Has patient had a PCN reaction causing severe rash involving mucus membranes or skin necrosis: No Has patient had a PCN reaction that required hospitalization Yes Has patient had a PCN reaction occurring within the last 10 years: No If all of the above answers are "NO", then may proceed with Cephalosporin use.      Sulfa Antibiotics Rash    ROS Review of Systems  Constitutional:  Negative for appetite change, chills, fatigue and fever.  HENT:  Negative for congestion, dental problem, ear pain and sore throat.   Eyes:  Negative for discharge, redness and visual disturbance.  Respiratory:  Negative for cough, chest tightness, shortness of breath and wheezing.   Cardiovascular:  Negative for chest pain, palpitations and leg swelling.  Gastrointestinal:  Negative for abdominal pain, blood in stool, diarrhea, nausea and vomiting.  Genitourinary:  Negative for difficulty urinating, dysuria, flank pain, frequency, hematuria and urgency.  Musculoskeletal:  Negative for arthralgias, back pain, joint swelling, myalgias and neck stiffness.  Skin:  Negative for pallor and rash.  Neurological:  Negative for dizziness, speech difficulty, weakness and headaches.  Hematological:  Negative for adenopathy. Does not bruise/bleed easily.  Psychiatric/Behavioral:  Negative for confusion and sleep disturbance. The patient is not nervous/anxious.    PE; Vitals  with BMI 10/27/2020 04/24/2019 03/04/2018  Height 5\' 7"  - 5' 6.25"  Weight 226 lbs - 215 lbs  BMI 35.39 - 34.43  Systolic 121 105  Diastolic 85 73 90  Pulse 83 75 81   Exam chaperoned by 366, CMA.  Gen: Alert, well appearing.  Patient is oriented to person, place, time, and situation. AFFECT: pleasant, lucid thought and speech. ENT: Ears: EACs clear, normal epithelium.  TMs with good light reflex and landmarks bilaterally.  Eyes: no injection, icteris, swelling, or exudate.  EOMI, PERRLA. Nose: no drainage or turbinate edema/swelling.  No injection or focal lesion.  Mouth: lips without  lesion/swelling.  Oral mucosa pink and moist.  Dentition intact and without obvious caries or gingival swelling.  Oropharynx without erythema, exudate, or swelling.  Neck: supple/nontender.  No LAD, mass, or TM.  Carotid pulses 2+ bilaterally, without bruits. CV: RRR, no m/r/g.   LUNGS: CTA bilat, nonlabored resps, good aeration in all lung fields. ABD: soft, NT, ND, BS normal.  No hepatospenomegaly or mass.  No bruits. EXT: no clubbing, cyanosis, or edema.  Musculoskeletal: no joint swelling, erythema, warmth, or tenderness.  ROM of all joints intact. Skin - no sores or suspicious lesions or rashes or color changes  Pertinent labs:  Lab Results  Component Value Date   TSH 1.72 10/27/2019   Lab Results  Component Value Date   WBC 10.0 10/27/2019   HGB 13.8 10/27/2019   HCT 40.2 10/27/2019   MCV 90.3 10/27/2019   PLT 312.0 10/27/2019   Lab Results  Component Value Date   CREATININE 0.75 10/27/2019   BUN 8 10/27/2019   NA 137 10/27/2019   K 4.2 10/27/2019   CL 102 10/27/2019   CO2 28 10/27/2019   Lab Results  Component Value Date   ALT 16 10/27/2019   AST 16 10/27/2019   ALKPHOS 80 10/27/2019   BILITOT 0.8 10/27/2019   Lab Results  Component Value Date   CHOL 162 10/27/2019   Lab Results  Component Value Date   HDL 39.40 10/27/2019   Lab Results  Component Value  Date   LDLCALC 74 03/04/2018   Lab Results  Component Value Date   TRIG 213.0 (H) 10/27/2019   Lab Results  Component Value Date   CHOLHDL 4 10/27/2019   ASSESSMENT AND PLAN:   1) HTN: well controlled on labetalol 300 bid. Lytes/cr today.  2) GAD: stable on sertaline 100 qd long term.  3) Health maintenance exam: Reviewed age and gender appropriate health maintenance issues (prudent diet, regular exercise, health risks of tobacco and excessive alcohol, use of seatbelts, fire alarms in home, use of sunscreen).  Also reviewed age and gender appropriate health screening as well as vaccine recommendations. Vaccines: ALL UTD. Labs: fasting HP ordered, hep c screening. Cervical ca screening: as per [facility]. Breast ca screening: as per [facility]. Colon ca screening: average risk patient= as per latest guidelines, start screening at any time now.  Discussed stool-based screening tests and colonoscopy-->iFOB ordered.  An After Visit Summary was printed and given to the patient.  FOLLOW UP:  Return in about 1 year (around 10/27/2021).  Signed:  Santiago Bumpers, MD           10/27/2020

## 2020-10-28 LAB — HEPATITIS C ANTIBODY
Hepatitis C Ab: NONREACTIVE
SIGNAL TO CUT-OFF: 0.01 (ref <1.00)

## 2020-10-29 ENCOUNTER — Other Ambulatory Visit: Payer: Self-pay

## 2020-10-29 DIAGNOSIS — E781 Pure hyperglyceridemia: Secondary | ICD-10-CM

## 2020-11-23 NOTE — Addendum Note (Signed)
Addended by: Paschal Dopp on: 11/23/2020 10:47 AM   Modules accepted: Orders

## 2020-11-24 ENCOUNTER — Encounter: Payer: Self-pay | Admitting: Family Medicine

## 2020-11-24 LAB — FECAL OCCULT BLOOD, IMMUNOCHEMICAL: Fecal Occult Bld: NEGATIVE

## 2021-05-02 ENCOUNTER — Ambulatory Visit: Payer: BC Managed Care – PPO

## 2021-10-31 ENCOUNTER — Other Ambulatory Visit: Payer: Self-pay

## 2021-10-31 MED ORDER — LABETALOL HCL 300 MG PO TABS: ORAL_TABLET | ORAL | 0 refills | Status: DC

## 2021-10-31 MED ORDER — SERTRALINE HCL 100 MG PO TABS: 100.0000 mg | ORAL_TABLET | Freq: Every day | ORAL | 0 refills | Status: DC

## 2021-12-07 ENCOUNTER — Other Ambulatory Visit: Payer: Self-pay

## 2021-12-07 MED ORDER — LABETALOL HCL 300 MG PO TABS: ORAL_TABLET | ORAL | 0 refills | Status: DC

## 2021-12-07 MED ORDER — SERTRALINE HCL 100 MG PO TABS: 100.0000 mg | ORAL_TABLET | Freq: Every day | ORAL | 0 refills | Status: DC

## 2021-12-29 ENCOUNTER — Telehealth: Payer: Self-pay | Admitting: Family Medicine

## 2021-12-29 MED ORDER — SERTRALINE HCL 100 MG PO TABS: 100.0000 mg | ORAL_TABLET | Freq: Every day | ORAL | 0 refills | Status: DC

## 2021-12-29 NOTE — Telephone Encounter (Signed)
30 day supply sent, pt was informed.

## 2021-12-29 NOTE — Telephone Encounter (Signed)
Pt is scheduled for the 25th of Oct, however she needs a refill on her Sertraline until that appointment. She currently has 3 pills left.  Please call patient if this request can be honored.

## 2022-01-18 ENCOUNTER — Encounter: Payer: Self-pay | Admitting: Family Medicine

## 2022-01-18 ENCOUNTER — Ambulatory Visit (INDEPENDENT_AMBULATORY_CARE_PROVIDER_SITE_OTHER): Payer: 59 | Admitting: Family Medicine

## 2022-01-18 VITALS — BP 117/83 | HR 81 | Temp 98.8°F | Ht 67.0 in | Wt 223.8 lb

## 2022-01-18 DIAGNOSIS — F411 Generalized anxiety disorder: Secondary | ICD-10-CM

## 2022-01-18 DIAGNOSIS — E781 Pure hyperglyceridemia: Secondary | ICD-10-CM

## 2022-01-18 DIAGNOSIS — Z1211 Encounter for screening for malignant neoplasm of colon: Secondary | ICD-10-CM

## 2022-01-18 DIAGNOSIS — I10 Essential (primary) hypertension: Secondary | ICD-10-CM | POA: Diagnosis not present

## 2022-01-18 DIAGNOSIS — Z0000 Encounter for general adult medical examination without abnormal findings: Secondary | ICD-10-CM | POA: Diagnosis not present

## 2022-01-18 DIAGNOSIS — Z23 Encounter for immunization: Secondary | ICD-10-CM

## 2022-01-18 DIAGNOSIS — Z1231 Encounter for screening mammogram for malignant neoplasm of breast: Secondary | ICD-10-CM

## 2022-01-18 MED ORDER — LABETALOL HCL 300 MG PO TABS: ORAL_TABLET | ORAL | 3 refills | Status: DC

## 2022-01-18 MED ORDER — SERTRALINE HCL 100 MG PO TABS: 100.0000 mg | ORAL_TABLET | Freq: Every day | ORAL | 3 refills | Status: DC

## 2022-01-18 NOTE — Progress Notes (Signed)
Office Note 01/18/2022  CC:  Chief Complaint  Patient presents with   Annual Exam    Pt is fasting   HPI:  Patient is a 46 y.o. female who is here for annual health maintenance exam and follow-up hypertension and GAD.  Mary Gross feels well. She is staying active with her kids, going to [place] pop concerts. Doing well on sertraline, mood and anxiety levels stable.  Home blood pressures consistently normal. She walks most days for exercise.  Past Medical History:  Diagnosis Date   Anxiety    zoloft 25mg  helps a lot   Colon cancer screening 10/2020   10/2020 iFOB neg.   Complication of anesthesia    leg weakness for 1 year after epidural in 2001   Depression    after pregnancy loss was on zoloft   Hypertension    during pregnancy, + white coat (trial of enalapril  + ACE/HCTZ caused bp too low/sluggish.  Labetolol long term.   Hypertriglyceridemia 2021   mild->TLC   Lumbar spondylosis 07/27/2011   with grade I spondylolisthesis (x-ray at chiropracter's office)   Plantar fasciitis of right foot 07/2017   Dr. Paulla Fore   Pregnancy-induced hypertension, history with 1st preg 05/09/2011; 2015/16    Past Surgical History:  Procedure Laterality Date   BARTHOLIN GLAND CYST EXCISION     7-8 yrs ago   CHOLECYSTECTOMY  07/07/2011   Procedure: LAPAROSCOPIC CHOLECYSTECTOMY;  Surgeon: Jamesetta So, MD;  Location: [facility];  Service: General;  Laterality: N/A;   CYSTECTOMY  2007   vulvar cyst   EYE SURGERY     lasik   INTRAUTERINE DEVICE (IUD) INSERTION  01/01/2017   Mirena   IUD REMOVAL  08/19/2020    Family History  Problem Relation Age of Onset   Peripheral vascular disease Mother    Depression Mother    Hypertension Father    Kidney disease Father        Bright's Disease as a child   Hypertension Paternal Aunt    Thyroid disease Maternal Grandmother    Cancer Maternal Grandmother        bladder    Stroke Paternal Grandmother    Parkinsonism Paternal Grandmother    Stroke  Paternal Grandfather    Hypertension Maternal Uncle    Anesthesia problems Neg Hx    Hypotension Neg Hx    Malignant hyperthermia Neg Hx    Pseudochol deficiency Neg Hx     Social History   Socioeconomic History   Marital status: Legally Separated    Spouse name: Not on file   Number of children: Not on file   Years of education: Not on file   Highest education level: Not on file  Occupational History   Not on file  Tobacco Use   Smoking status: Never   Smokeless tobacco: Never  Substance and Sexual Activity   Alcohol use: No   Drug use: No   Sexual activity: Yes    Partners: Male    Birth control/protection: None  Other Topics Concern   Not on file  Social History Narrative   Separated/divorced 2018, 4 children --2 sons and 2 daughters.   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     Stay at home mom.   Ex-husband works in Engineer, technical sales at [employer] in [place].   No T/A/Ds.   Exercise--"chasing kids around".  Normal american diet.         Social Determinants of Health   Financial Resource Strain: Not on file  Food Insecurity:  Not on file  Transportation Needs: Not on file  Physical Activity: Not on file  Stress: Not on file  Social Connections: Not on file  Intimate Partner Violence: Not on file    Outpatient Medications Prior to Visit  Medication Sig Dispense Refill   APPLE CIDER VINEGAR PO Take by mouth daily.     CRANBERRY PO Take 2 capsules daily by mouth.     KRILL OIL PO Take by mouth daily.     Multiple Vitamin (MULTIVITAMIN) tablet Take 2 tablets daily by mouth.      Probiotic Product (PROBIOTIC PO) Take 1 capsule by mouth daily.     labetalol (NORMODYNE) 300 MG tablet 1 tab po bid 28 tablet 0   sertraline (ZOLOFT) 100 MG tablet Take 1 tablet (100 mg total) by mouth daily. 30 tablet 0   Omega-3 Fatty Acids (FISH OIL) 1000 MG CAPS Take by mouth. (Patient not taking: Reported on 01/18/2022)     No facility-administered medications prior to visit.    Allergies   Allergen Reactions   Hctz [Hydrochlorothiazide] Other (See Comments)    Malaise/HA/orthostatic sx's   Ciprofloxacin Rash    Itchy/burning skin with MINIMAL rash per pt report   Codeine Rash   Iron Rash   Penicillins Rash    Has patient had a PCN reaction causing immediate rash, facial/tongue/throat swelling, SOB or lightheadedness with hypotension: No Has patient had a PCN reaction causing severe rash involving mucus membranes or skin necrosis: No Has patient had a PCN reaction that required hospitalization Yes Has patient had a PCN reaction occurring within the last 10 years: No If all of the above answers are "NO", then may proceed with Cephalosporin use.      Sulfa Antibiotics Rash    ROS Review of Systems  Constitutional:  Negative for appetite change, chills, fatigue and fever.  HENT:  Negative for congestion, dental problem, ear pain and sore throat.   Eyes:  Negative for discharge, redness and visual disturbance.  Respiratory:  Negative for cough, chest tightness, shortness of breath and wheezing.   Cardiovascular:  Negative for chest pain, palpitations and leg swelling.  Gastrointestinal:  Negative for abdominal pain, blood in stool, diarrhea, nausea and vomiting.  Genitourinary:  Negative for difficulty urinating, dysuria, flank pain, frequency, hematuria and urgency.  Musculoskeletal:  Negative for arthralgias, back pain, joint swelling, myalgias and neck stiffness.  Skin:  Negative for pallor and rash.  Neurological:  Negative for dizziness, speech difficulty, weakness and headaches.  Hematological:  Negative for adenopathy. Does not bruise/bleed easily.  Psychiatric/Behavioral:  Negative for confusion and sleep disturbance. The patient is not nervous/anxious.     PE;    01/18/2022    9:29 AM 10/27/2020   10:03 AM 04/24/2019    3:09 PM  Vitals with BMI  Height 5\' 7"  5\' 7"    Weight 223 lbs 13 oz 226 lbs   BMI [id number]   Systolic [id number]  Diastolic 83 85  73  Pulse 81 83 75   Exam chaperoned by Deveron Furlong, CMA.  Gen: Alert, well appearing.  Patient is oriented to person, place, time, and situation. AFFECT: pleasant, lucid thought and speech. ENT: Ears: EACs clear, normal epithelium.  TMs with good light reflex and landmarks bilaterally.  Eyes: no injection, icteris, swelling, or exudate.  EOMI, PERRLA. Nose: no drainage or turbinate edema/swelling.  No injection or focal lesion.  Mouth: lips without lesion/swelling.  Oral mucosa pink and moist.  Dentition intact and  without obvious caries or gingival swelling.  Oropharynx without erythema, exudate, or swelling.  Neck: supple/nontender.  No LAD, mass, or TM.  Carotid pulses 2+ bilaterally, without bruits. CV: RRR, no m/r/g.   LUNGS: CTA bilat, nonlabored resps, good aeration in all lung fields. ABD: soft, NT, ND, BS normal.  No hepatospenomegaly or mass.  No bruits. EXT: no clubbing, cyanosis, or edema.  Musculoskeletal: no joint swelling, erythema, warmth, or tenderness.  ROM of all joints intact. Skin - no sores or suspicious lesions or rashes or color changes  Pertinent labs:  Lab Results  Component Value Date   TSH 2.12 10/27/2020   Lab Results  Component Value Date   WBC 9.3 10/27/2020   HGB 13.7 10/27/2020   HCT 40.0 10/27/2020   MCV 88.8 10/27/2020   PLT 299.0 10/27/2020   Lab Results  Component Value Date   CREATININE 0.76 10/27/2020   BUN 11 10/27/2020   NA 138 10/27/2020   K 4.2 10/27/2020   CL 104 10/27/2020   CO2 24 10/27/2020   Lab Results  Component Value Date   ALT 13 10/27/2020   AST 13 10/27/2020   ALKPHOS 73 10/27/2020   BILITOT 0.5 10/27/2020   Lab Results  Component Value Date   CHOL 152 10/27/2020   Lab Results  Component Value Date   HDL 37.50 (L) 10/27/2020   Lab Results  Component Value Date   LDLCALC 74 03/04/2018   Lab Results  Component Value Date   TRIG 326.0 (H) 10/27/2020   Lab Results  Component Value Date   CHOLHDL 4  10/27/2020   ASSESSMENT AND PLAN:   #1 health maintenance exam: Reviewed age and gender appropriate health maintenance issues (prudent diet, regular exercise, health risks of tobacco and excessive alcohol, use of seatbelts, fire alarms in home, use of sunscreen).  Also reviewed age and gender appropriate health screening as well as vaccine recommendations. Vaccines: Flu ->given today.  Otherwise ALL UTD. Labs: fasting HP ordered Cervical ca screening: as per [facility]. Breast ca screening: mammogram UTD 01/02/22--as per [facility]. Colon ca screening: iFOB neg 1 yr ago.  Cologuard ordered today.  #2 hypertension, well controlled on labetalol 300 mg twice daily. Electrolytes and creatinine today.  3.  GAD, doing well long-term on sertraline 100 mg a day.  Refilled medications today.  An After Visit Summary was printed and given to the patient.  FOLLOW UP:  Return in about 1 year (around 01/19/2023) for annual CPE (fasting).  Signed:  Crissie Sickles, MD           01/18/2022

## 2022-01-18 NOTE — Patient Instructions (Signed)

## 2022-01-19 LAB — COMPREHENSIVE METABOLIC PANEL
AG Ratio: 1.7 (calc) (ref 1.0–2.5)
ALT: 18 U/L (ref 6–29)
AST: 16 U/L (ref 10–35)
Albumin: 4.3 g/dL (ref 3.6–5.1)
Alkaline phosphatase (APISO): 83 U/L (ref 31–125)
BUN: 10 mg/dL (ref 7–25)
CO2: 25 mmol/L (ref 20–32)
Calcium: 9.4 mg/dL (ref 8.6–10.2)
Chloride: 104 mmol/L (ref 98–110)
Creat: 0.77 mg/dL (ref 0.50–0.99)
Globulin: 2.5 g/dL (calc) (ref 1.9–3.7)
Glucose, Bld: 91 mg/dL (ref 65–99)
Potassium: 4.3 mmol/L (ref 3.5–5.3)
Sodium: 139 mmol/L (ref 135–146)
Total Bilirubin: 0.6 mg/dL (ref 0.2–1.2)
Total Protein: 6.8 g/dL (ref 6.1–8.1)

## 2022-01-19 LAB — LIPID PANEL
Cholesterol: 168 mg/dL (ref <200)
HDL: 48 mg/dL — ABNORMAL LOW (ref >=50)
LDL Cholesterol (Calc): 92 mg/dL (calc)
Non-HDL Cholesterol (Calc): 120 mg/dL (calc) (ref <130)
Total CHOL/HDL Ratio: 3.5 (calc) (ref <5.0)
Triglycerides: 181 mg/dL — ABNORMAL HIGH (ref <150)

## 2022-01-19 LAB — CBC
HCT: 39.2 % (ref 35.0–45.0)
Hemoglobin: 13.7 g/dL (ref 11.7–15.5)
MCH: 30.8 pg (ref 27.0–33.0)
MCHC: 34.9 g/dL (ref 32.0–36.0)
MCV: 88.1 fL (ref 80.0–100.0)
MPV: 10.4 fL (ref 7.5–12.5)
Platelets: 327 10*3/uL (ref 140–400)
RBC: 4.45 10*6/uL (ref 3.80–5.10)
RDW: 12.7 % (ref 11.0–15.0)
WBC: 8.9 10*3/uL (ref 3.8–10.8)

## 2022-01-19 LAB — TSH: TSH: 2.9 mIU/L

## 2022-02-11 LAB — COLOGUARD: COLOGUARD: NEGATIVE

## 2022-02-12 ENCOUNTER — Encounter: Payer: Self-pay | Admitting: Family Medicine

## 2022-04-12 ENCOUNTER — Encounter: Payer: Self-pay | Admitting: Family Medicine

## 2022-04-12 ENCOUNTER — Ambulatory Visit: Payer: 59 | Admitting: Family Medicine

## 2022-04-12 VITALS — BP 107/73 | HR 91 | Temp 98.4°F | Ht 67.0 in | Wt 226.6 lb

## 2022-04-12 DIAGNOSIS — R109 Unspecified abdominal pain: Secondary | ICD-10-CM | POA: Diagnosis not present

## 2022-04-12 DIAGNOSIS — M898X8 Other specified disorders of bone, other site: Secondary | ICD-10-CM | POA: Diagnosis not present

## 2022-04-12 NOTE — Progress Notes (Signed)
OFFICE VISIT  04/12/2022  CC:  Chief Complaint  Patient presents with   Flank Pain    Right side x 2-3 days. Feel worse with standing or any type of movement. C/o lots of gas pressure. Has taken Tylenol but has not been helpful. Last dose yesterday 1-2pm    Patient is a 47 y.o. female who presents accompanied by her mother for pain in right side.  HPI: About 48 hours ago she started to have pain in the right side over the iliac crest region. Constant and mild intensity but much worse when she tries to sit up from a supine posture or when laying down after sitting up. She has some intermittent bloated feeling and mild nausea but her appetite and food intake has been good.  She says the symptoms are not that uncommon for her, but the pain on the right side is new. No change in bowel habits, no blood in stool. No blood in urine, no dysuria or urinary urgency or frequency. No rash. No preceding overuse, strain, or trauma. She tried a dose of Tylenol and it did not help.   Past Medical History:  Diagnosis Date   Anxiety    zoloft 25mg  helps a lot   Colon cancer screening 10/2020   10/2020 iFOB neg.   Colon cancer screening    cologuard NEG 54/0981   Complication of anesthesia    leg weakness for 1 year after epidural in 2001   Depression    after pregnancy loss was on zoloft   Hypertension    during pregnancy, + white coat (trial of enalapril  + ACE/HCTZ caused bp too low/sluggish.  Labetolol long term.   Hypertriglyceridemia 2021   mild->TLC   Lumbar spondylosis 07/27/2011   with grade I spondylolisthesis (x-ray at chiropracter's office)   Plantar fasciitis of right foot 07/2017   Dr. Paulla Fore   Pregnancy-induced hypertension, history with 1st preg 05/09/2011; 2015/16    Past Surgical History:  Procedure Laterality Date   BARTHOLIN GLAND CYST EXCISION     7-8 yrs ago   CHOLECYSTECTOMY  07/07/2011   Procedure: LAPAROSCOPIC CHOLECYSTECTOMY;  Surgeon: Jamesetta So, MD;   Location: [facility];  Service: General;  Laterality: N/A;   CYSTECTOMY  2007   vulvar cyst   EYE SURGERY     lasik   INTRAUTERINE DEVICE (IUD) INSERTION  01/01/2017   Mirena   IUD REMOVAL  08/19/2020    Outpatient Medications Prior to Visit  Medication Sig Dispense Refill   APPLE CIDER VINEGAR PO Take by mouth daily.     CRANBERRY PO Take 2 capsules daily by mouth.     KRILL OIL PO Take by mouth daily.     labetalol (NORMODYNE) 300 MG tablet 1 tab po bid 180 tablet 3   Multiple Vitamin (MULTIVITAMIN) tablet Take 2 tablets daily by mouth.      Probiotic Product (PROBIOTIC PO) Take 1 capsule by mouth daily.     sertraline (ZOLOFT) 100 MG tablet Take 1 tablet (100 mg total) by mouth daily. 90 tablet 3   No facility-administered medications prior to visit.    Allergies  Allergen Reactions   Hctz [Hydrochlorothiazide] Other (See Comments)    Malaise/HA/orthostatic sx's   Ciprofloxacin Rash    Itchy/burning skin with MINIMAL rash per pt report   Codeine Rash   Iron Rash   Penicillins Rash    Has patient had a PCN reaction causing immediate rash, facial/tongue/throat swelling, SOB or lightheadedness with hypotension: No  Has patient had a PCN reaction causing severe rash involving mucus membranes or skin necrosis: No Has patient had a PCN reaction that required hospitalization Yes Has patient had a PCN reaction occurring within the last 10 years: No If all of the above answers are "NO", then may proceed with Cephalosporin use.      Sulfa Antibiotics Rash    Review of Systems  As per HPI  PE:    04/12/2022   10:57 AM 01/18/2022    9:29 AM 10/27/2020   10:03 AM  Vitals with BMI  Height 5\' 7"  5\' 7"  5\' 7"   Weight 226 lbs 10 oz 223 lbs 13 oz 226 lbs  BMI 35.48 41.32 44.01  Systolic 027 253 664  Diastolic 73 83 85  Pulse 91 81 83     Physical Exam  Gen: Alert, well appearing.  Patient is oriented to person, place, time, and situation. AFFECT: pleasant, lucid thought and  speech. Abdomen nontender.  Bowel sounds normal.  No distention. No HSM or mass. She has mild focal tenderness to palpation around the intersection of the abdominal wall muscles and  the right iliac crest, Without rebound or guarding. No groin tenderness.  Range of motion of hip is fully intact without pain.  Resistance to hip range of motion does not elicit her pain. No rash.  LABS:  Last CBC Lab Results  Component Value Date   WBC 8.9 01/18/2022   HGB 13.7 01/18/2022   HCT 39.2 01/18/2022   MCV 88.1 01/18/2022   MCH 30.8 01/18/2022   RDW 12.7 01/18/2022   PLT 327 40/34/7425   Last metabolic panel Lab Results  Component Value Date   GLUCOSE 91 01/18/2022   NA 139 01/18/2022   K 4.3 01/18/2022   CL 104 01/18/2022   CO2 25 01/18/2022   BUN 10 01/18/2022   CREATININE 0.77 01/18/2022   GFRNONAA >60 03/30/2015   CALCIUM 9.4 01/18/2022   PROT 6.8 01/18/2022   ALBUMIN 4.2 10/27/2020   BILITOT 0.6 01/18/2022   ALKPHOS 73 10/27/2020   AST 16 01/18/2022   ALT 18 01/18/2022   ANIONGAP 11 03/30/2015   IMPRESSION AND PLAN:  Right lower side/iliac crest pain.  Muscular most likely. Exam benign. Reassurance given.  Gentle range of motion/stretching recommended.  Recommended ibuprofen 600 mg twice daily with food for the next few days to see if this helps. Signs/symptoms to call or return for were reviewed and pt expressed understanding.  An After Visit Summary was printed and given to the patient.  FOLLOW UP: No follow-ups on file.  Signed:  Crissie Sickles, MD           04/12/2022

## 2022-10-02 ENCOUNTER — Encounter: Payer: Self-pay | Admitting: Family Medicine

## 2023-02-01 ENCOUNTER — Other Ambulatory Visit: Payer: Self-pay

## 2023-02-01 MED ORDER — LABETALOL HCL 300 MG PO TABS: ORAL_TABLET | ORAL | 0 refills | Status: DC

## 2023-02-01 NOTE — Telephone Encounter (Signed)
RF request for labetalol (NORMODYNE) 300 MG tablet  LOV: 04/12/22 Next ov: due for CPE Oct/Nov Last written: 01/18/22 (180,3)  Pt was given 30 day supply with note to schedule appt with PCP.

## 2023-03-03 ENCOUNTER — Other Ambulatory Visit: Payer: Self-pay | Admitting: Family Medicine

## 2023-03-12 ENCOUNTER — Ambulatory Visit (INDEPENDENT_AMBULATORY_CARE_PROVIDER_SITE_OTHER): Payer: 59 | Admitting: Family Medicine

## 2023-03-12 VITALS — BP 145/93 | HR 76 | Ht 67.0 in | Wt 211.2 lb

## 2023-03-12 DIAGNOSIS — Z0000 Encounter for general adult medical examination without abnormal findings: Secondary | ICD-10-CM

## 2023-03-12 DIAGNOSIS — I10 Essential (primary) hypertension: Secondary | ICD-10-CM | POA: Diagnosis not present

## 2023-03-12 DIAGNOSIS — Z23 Encounter for immunization: Secondary | ICD-10-CM

## 2023-03-12 NOTE — Patient Instructions (Signed)

## 2023-03-12 NOTE — Progress Notes (Signed)
Office Note 03/12/2023  CC:  Chief Complaint  Patient presents with   Annual Exam   Patient is a 47 y.o. female who is here for annual health maintenance exam and follow-up hypertension and GAD. A/P as of last visit: "1 health maintenance exam: Reviewed age and gender appropriate health maintenance issues (prudent diet, regular exercise, health risks of tobacco and excessive alcohol, use of seatbelts, fire alarms in home, use of sunscreen).  Also reviewed age and gender appropriate health screening as well as vaccine recommendations. Vaccines: Flu ->given today.  Otherwise ALL UTD. Labs: fasting HP ordered Cervical ca screening: as per [facility]. Breast ca screening: mammogram UTD 01/02/22--as per [facility]. Colon ca screening: iFOB neg 1 yr ago.  Cologuard ordered today.   #2 hypertension, well controlled on labetalol 300 mg twice daily. Electrolytes and creatinine today.   3.  GAD, doing well long-term on sertraline 100 mg a day."  INTERIM HX: Noreen is feeling very well. She has been dieting and has lost about 15 pounds. She felt good from a anxiety standpoint so she stopped taking her sertraline and feels like she continues to feel well. She has not taken her blood pressure medication today.  She does not monitor blood pressure at home anymore but says that her recent GYN office visit the blood pressure was normal.   Past Medical History:  Diagnosis Date   Anxiety    zoloft 25mg  helps a lot   Colon cancer screening 10/2020   10/2020 iFOB neg.   Colon cancer screening    cologuard NEG 01/2022   Complication of anesthesia    leg weakness for 1 year after epidural in 2001   Depression    after pregnancy loss was on zoloft   Hypertension    during pregnancy, + white coat (trial of enalapril  + ACE/HCTZ caused bp too low/sluggish.  Labetolol long term.   Hypertriglyceridemia 2021   mild->TLC   Lumbar spondylosis 07/27/2011   with grade I  spondylolisthesis (x-ray at chiropracter's office)   Plantar fasciitis of right foot 07/2017   Dr. Berline Chough   Pregnancy-induced hypertension, history with 1st preg 05/09/2011; 2015/16    Past Surgical History:  Procedure Laterality Date   BARTHOLIN GLAND CYST EXCISION     7-8 yrs ago   CHOLECYSTECTOMY  07/07/2011   Procedure: LAPAROSCOPIC CHOLECYSTECTOMY;  Surgeon: Dalia Heading, MD;  Location: [facility];  Service: General;  Laterality: N/A;   CYSTECTOMY  2007   vulvar cyst   EYE SURGERY     lasik   INTRAUTERINE DEVICE (IUD) INSERTION  01/01/2017   Mirena   IUD REMOVAL  08/19/2020    Family History  Problem Relation Age of Onset   Peripheral vascular disease Mother    Depression Mother    Hypertension Father    Kidney disease Father        Bright's Disease as a child   Hypertension Paternal Aunt    Thyroid disease Maternal Grandmother    Cancer Maternal Grandmother        bladder    Stroke Paternal Grandmother    Parkinsonism Paternal Grandmother    Stroke Paternal Grandfather    Hypertension Maternal Uncle    Anesthesia problems Neg Hx    Hypotension Neg Hx    Malignant hyperthermia Neg Hx    Pseudochol deficiency Neg Hx     Social History   Socioeconomic History   Marital status: Legally Separated    Spouse name: Not  on file   Number of children: Not on file   Years of education: Not on file   Highest education level: 12th grade  Occupational History   Not on file  Tobacco Use   Smoking status: Never   Smokeless tobacco: Never  Substance and Sexual Activity   Alcohol use: No   Drug use: No   Sexual activity: Yes    Partners: Male    Birth control/protection: None  Other Topics Concern   Not on file  Social History Narrative   Separated/divorced 2018, 4 children --2 sons and 2 daughters.   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     Stay at home mom.   Ex-husband works in Consulting civil engineer at [employer] in [place].   No T/A/Ds.   Exercise--"chasing kids around".  Normal  american diet.         Social Drivers of Health   Financial Resource Strain: Patient Declined (03/12/2023)   Overall Financial Resource Strain (CARDIA)    Difficulty of Paying Living Expenses: Patient declined  Food Insecurity: Patient Declined (03/12/2023)   Hunger Vital Sign    Worried About Running Out of Food in the Last Year: Patient declined    Ran Out of Food in the Last Year: Patient declined  Transportation Needs: No Transportation Needs (03/12/2023)   PRAPARE - Administrator, Civil Service (Medical): No    Lack of Transportation (Non-Medical): No  Physical Activity: Unknown (03/12/2023)   Exercise Vital Sign    Days of Exercise per Week: Patient declined    Minutes of Exercise per Session: Not on file  Stress: No Stress Concern Present (03/12/2023)   [employer] of Occupational Health - Occupational Stress Questionnaire    Feeling of Stress : Not at all  Social Connections: Socially Isolated (03/12/2023)   Social Connection and Isolation Panel [NHANES]    Frequency of Communication with Friends and Family: More than three times a week    Frequency of Social Gatherings with Friends and Family: More than three times a week    Attends Religious Services: Never    Database administrator or Organizations: No    Attends Engineer, structural: Not on file    Marital Status: Separated  Intimate Partner Violence: Not on file    Outpatient Medications Prior to Visit  Medication Sig Dispense Refill   APPLE CIDER VINEGAR PO Take by mouth daily.     CRANBERRY PO Take 2 capsules daily by mouth.     KRILL OIL PO Take by mouth daily.     labetalol (NORMODYNE) 300 MG tablet TAKE 1 TABLET BY MOUTH TWICE DAILY 30 tablet 0   Multiple Vitamin (MULTIVITAMIN) tablet Take 2 tablets daily by mouth.      Probiotic Product (PROBIOTIC PO) Take 1 capsule by mouth daily.     sertraline (ZOLOFT) 100 MG tablet Take 1 tablet (100 mg total) by mouth daily. 90 tablet 3    No facility-administered medications prior to visit.    Allergies  Allergen Reactions   Hctz [Hydrochlorothiazide] Other (See Comments)    Malaise/HA/orthostatic sx's   Ciprofloxacin Rash    Itchy/burning skin with MINIMAL rash per pt report   Codeine Rash   Iron Rash   Penicillins Rash    Has patient had a PCN reaction causing immediate rash, facial/tongue/throat swelling, SOB or lightheadedness with hypotension: No Has patient had a PCN reaction causing severe rash involving mucus membranes or skin necrosis: No Has patient had a PCN reaction  that required hospitalization Yes Has patient had a PCN reaction occurring within the last 10 years: No If all of the above answers are "NO", then may proceed with Cephalosporin use.      Sulfa Antibiotics Rash    Review of Systems  Constitutional:  Negative for appetite change, chills, fatigue and fever.  HENT:  Negative for congestion, dental problem, ear pain and sore throat.   Eyes:  Negative for discharge, redness and visual disturbance.  Respiratory:  Negative for cough, chest tightness, shortness of breath and wheezing.   Cardiovascular:  Negative for chest pain, palpitations and leg swelling.  Gastrointestinal:  Negative for abdominal pain, blood in stool, diarrhea, nausea and vomiting.  Genitourinary:  Negative for difficulty urinating, dysuria, flank pain, frequency, hematuria and urgency.  Musculoskeletal:  Negative for arthralgias, back pain, joint swelling, myalgias and neck stiffness.  Skin:  Negative for pallor and rash.  Neurological:  Negative for dizziness, speech difficulty, weakness and headaches.  Hematological:  Negative for adenopathy. Does not bruise/bleed easily.  Psychiatric/Behavioral:  Negative for confusion and sleep disturbance. The patient is not nervous/anxious.     PE;    03/12/2023    2:59 PM 04/12/2022   10:57 AM 01/18/2022    9:29 AM  Vitals with BMI  Height 5\' 7"  5\' 7"  5\' 7"   Weight 211 lbs 3  oz 226 lbs 10 oz 223 lbs 13 oz  BMI 33.07 35.48 35.04  Systolic 145 107 025  Diastolic 93 73 83  Pulse 76 91 81    Gen: Alert, well appearing.  Patient is oriented to person, place, time, and situation. AFFECT: pleasant, lucid thought and speech. ENT: Ears: EACs clear, normal epithelium.  TMs with good light reflex and landmarks bilaterally.  Eyes: no injection, icteris, swelling, or exudate.  EOMI, PERRLA. Nose: no drainage or turbinate edema/swelling.  No injection or focal lesion.  Mouth: lips without lesion/swelling.  Oral mucosa pink and moist.  Dentition intact and without obvious caries or gingival swelling.  Oropharynx without erythema, exudate, or swelling.  Neck: supple/nontender.  No LAD, mass, or TM.  Carotid pulses 2+ bilaterally, without bruits. CV: RRR, no m/r/g.   LUNGS: CTA bilat, nonlabored resps, good aeration in all lung fields. ABD: soft, NT, ND, BS normal.  No hepatospenomegaly or mass.  No bruits. EXT: no clubbing, cyanosis, or edema.  Musculoskeletal: no joint swelling, erythema, warmth, or tenderness.  ROM of all joints intact. Skin - no sores or suspicious lesions or rashes or color changes  Pertinent labs:  Lab Results  Component Value Date   TSH 2.90 01/18/2022   Lab Results  Component Value Date   WBC 8.9 01/18/2022   HGB 13.7 01/18/2022   HCT 39.2 01/18/2022   MCV 88.1 01/18/2022   PLT 327 01/18/2022   Lab Results  Component Value Date   CREATININE 0.77 01/18/2022   BUN 10 01/18/2022   NA 139 01/18/2022   K 4.3 01/18/2022   CL 104 01/18/2022   CO2 25 01/18/2022   Lab Results  Component Value Date   ALT 18 01/18/2022   AST 16 01/18/2022   ALKPHOS 73 10/27/2020   BILITOT 0.6 01/18/2022   Lab Results  Component Value Date   CHOL 168 01/18/2022   Lab Results  Component Value Date   HDL 48 (L) 01/18/2022   Lab Results  Component Value Date   LDLCALC 92 01/18/2022   Lab Results  Component Value Date   TRIG 181 (H) 01/18/2022  Lab Results  Component Value Date   CHOLHDL 3.5 01/18/2022   ASSESSMENT AND PLAN:   #1 health maintenance exam: Reviewed age and gender appropriate health maintenance issues (prudent diet, regular exercise, health risks of tobacco and excessive alcohol, use of seatbelts, fire alarms in home, use of sunscreen).  Also reviewed age and gender appropriate health screening as well as vaccine recommendations. Vaccines: Flu vaccine today.  ALL UTD. Labs: fasting HP ordered Cervical ca screening: as per [facility]. Breast ca screening: mammogram UTD --as per [facility]. Colon ca screening: Cologuard - 02/06/2022--> repeat approximately November 2026.  #2 hypertension, historically well-controlled on labetalol 300 mg twice a day.  Blood pressure here 140-145/84-93.  She has not taken her medication today. No changes made in treatment regimen today.  An After Visit Summary was printed and given to the patient.  FOLLOW UP:  No follow-ups on file.  Signed:  Santiago Bumpers, MD           03/12/2023

## 2023-03-13 LAB — LIPID PANEL
Cholesterol: 155 mg/dL (ref 0–200)
HDL: 46.3 mg/dL (ref >39.00)
LDL Cholesterol: 81 mg/dL (ref 0–99)
NonHDL: 108.26
Total CHOL/HDL Ratio: 3
Triglycerides: 137 mg/dL (ref 0.0–149.0)
VLDL: 27.4 mg/dL (ref 0.0–40.0)

## 2023-03-13 LAB — CBC WITH DIFFERENTIAL/PLATELET
Basophils Absolute: 0.1 10*3/uL (ref 0.0–0.1)
Basophils Relative: 1.3 % (ref 0.0–3.0)
Eosinophils Absolute: 0.2 10*3/uL (ref 0.0–0.7)
Eosinophils Relative: 2.8 % (ref 0.0–5.0)
HCT: 41.6 % (ref 36.0–46.0)
Hemoglobin: 13.9 g/dL (ref 12.0–15.0)
Lymphocytes Relative: 27.6 % (ref 12.0–46.0)
Lymphs Abs: 2.3 10*3/uL (ref 0.7–4.0)
MCHC: 33.4 g/dL (ref 30.0–36.0)
MCV: 92 fL (ref 78.0–100.0)
Monocytes Absolute: 0.4 10*3/uL (ref 0.1–1.0)
Monocytes Relative: 5.1 % (ref 3.0–12.0)
Neutro Abs: 5.2 10*3/uL (ref 1.4–7.7)
Neutrophils Relative %: 63.2 % (ref 43.0–77.0)
Platelets: 343 10*3/uL (ref 150.0–400.0)
RBC: 4.52 Mil/uL (ref 3.87–5.11)
RDW: 13.1 % (ref 11.5–15.5)
WBC: 8.2 10*3/uL (ref 4.0–10.5)

## 2023-03-13 LAB — COMPREHENSIVE METABOLIC PANEL
ALT: 15 U/L (ref 0–35)
AST: 15 U/L (ref 0–37)
Albumin: 4.4 g/dL (ref 3.5–5.2)
Alkaline Phosphatase: 65 U/L (ref 39–117)
BUN: 11 mg/dL (ref 6–23)
CO2: 28 meq/L (ref 19–32)
Calcium: 9.7 mg/dL (ref 8.4–10.5)
Chloride: 105 meq/L (ref 96–112)
Creatinine, Ser: 0.75 mg/dL (ref 0.40–1.20)
GFR: 94.44 mL/min (ref >60.00)
Glucose, Bld: 100 mg/dL — ABNORMAL HIGH (ref 70–99)
Potassium: 4 meq/L (ref 3.5–5.1)
Sodium: 140 meq/L (ref 135–145)
Total Bilirubin: 0.7 mg/dL (ref 0.2–1.2)
Total Protein: 6.8 g/dL (ref 6.0–8.3)

## 2023-03-13 LAB — TSH: TSH: 1.25 u[IU]/mL (ref 0.35–5.50)

## 2023-03-14 ENCOUNTER — Encounter: Payer: Self-pay | Admitting: Family Medicine

## 2023-04-02 ENCOUNTER — Ambulatory Visit: Payer: 59 | Admitting: Family Medicine

## 2023-05-11 ENCOUNTER — Other Ambulatory Visit: Payer: Self-pay | Admitting: Family Medicine

## 2023-06-19 DIAGNOSIS — Z1211 Encounter for screening for malignant neoplasm of colon: Secondary | ICD-10-CM

## 2023-07-02 ENCOUNTER — Encounter: Payer: Self-pay | Admitting: Family Medicine

## 2023-07-03 NOTE — Addendum Note (Signed)
 Addended by: Emi Holes D on: 07/03/2023 08:17 AM   Modules accepted: Orders

## 2023-09-06 ENCOUNTER — Encounter: Payer: Self-pay | Admitting: Family Medicine

## 2023-09-06 NOTE — Telephone Encounter (Signed)
 Can pt be seen at 8:20 tomorrow for virtual? It would have to be overbook since 9 patients already for the morning

## 2023-09-06 NOTE — Telephone Encounter (Signed)
 Confirmed patient is at urgent care currently

## 2023-09-06 NOTE — Telephone Encounter (Signed)
 This is a problem that needs to be evaluated in person. I looked at his chart and it looks like Mary Gross went to [facility] urgent care today.  Please verify with mom.

## 2024-01-31 ENCOUNTER — Encounter: Payer: Self-pay | Admitting: Family Medicine

## 2024-02-15 ENCOUNTER — Encounter: Payer: Self-pay | Admitting: Family Medicine

## 2024-03-12 ENCOUNTER — Encounter: Payer: 59 | Admitting: Family Medicine

## 2024-04-07 ENCOUNTER — Encounter: Admitting: Family Medicine

## 2024-05-29 ENCOUNTER — Encounter: Admitting: Family Medicine
